# Patient Record
Sex: Female | Born: 1990 | Race: Black or African American | Hispanic: No | Marital: Single | Smoking: Current some day smoker
Health system: Southern US, Community
[De-identification: ages and names within clinical notes are randomized; demographics above are authoritative.]

## PROBLEM LIST (undated history)

## (undated) ENCOUNTER — Inpatient Hospital Stay (HOSPITAL_COMMUNITY): Payer: Self-pay

## (undated) DIAGNOSIS — G4700 Insomnia, unspecified: Secondary | ICD-10-CM

## (undated) DIAGNOSIS — A5609 Other chlamydial infection of lower genitourinary tract: Secondary | ICD-10-CM

## (undated) DIAGNOSIS — F419 Anxiety disorder, unspecified: Secondary | ICD-10-CM

## (undated) DIAGNOSIS — A549 Gonococcal infection, unspecified: Secondary | ICD-10-CM

## (undated) DIAGNOSIS — Z202 Contact with and (suspected) exposure to infections with a predominantly sexual mode of transmission: Secondary | ICD-10-CM

## (undated) DIAGNOSIS — L309 Dermatitis, unspecified: Secondary | ICD-10-CM

## (undated) DIAGNOSIS — F32A Depression, unspecified: Secondary | ICD-10-CM

## (undated) DIAGNOSIS — Z973 Presence of spectacles and contact lenses: Secondary | ICD-10-CM

## (undated) DIAGNOSIS — Z01419 Encounter for gynecological examination (general) (routine) without abnormal findings: Secondary | ICD-10-CM

## (undated) DIAGNOSIS — F329 Major depressive disorder, single episode, unspecified: Secondary | ICD-10-CM

## (undated) HISTORY — DX: Depression, unspecified: F32.A

## (undated) HISTORY — DX: Gonococcal infection, unspecified: A54.9

## (undated) HISTORY — DX: Presence of spectacles and contact lenses: Z97.3

## (undated) HISTORY — DX: Encounter for gynecological examination (general) (routine) without abnormal findings: Z01.419

## (undated) HISTORY — DX: Insomnia, unspecified: G47.00

## (undated) HISTORY — DX: Major depressive disorder, single episode, unspecified: F32.9

## (undated) HISTORY — DX: Dermatitis, unspecified: L30.9

## (undated) HISTORY — PX: WISDOM TOOTH EXTRACTION: SHX21

---
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
## 2012-05-28 ENCOUNTER — Ambulatory Visit (INDEPENDENT_AMBULATORY_CARE_PROVIDER_SITE_OTHER): Payer: BC Managed Care – PPO | Admitting: Medical

## 2012-05-28 ENCOUNTER — Encounter: Payer: Self-pay | Admitting: Medical

## 2012-05-28 VITALS — BP 110/80 | HR 68 | Temp 98.6°F | Resp 16 | Ht 61.5 in | Wt 125.0 lb

## 2012-05-28 DIAGNOSIS — L259 Unspecified contact dermatitis, unspecified cause: Secondary | ICD-10-CM

## 2012-05-28 DIAGNOSIS — R82998 Other abnormal findings in urine: Secondary | ICD-10-CM

## 2012-05-28 DIAGNOSIS — G4700 Insomnia, unspecified: Secondary | ICD-10-CM

## 2012-05-28 DIAGNOSIS — R8290 Unspecified abnormal findings in urine: Secondary | ICD-10-CM

## 2012-05-28 DIAGNOSIS — Z0000 Encounter for general adult medical examination without abnormal findings: Secondary | ICD-10-CM

## 2012-05-28 DIAGNOSIS — L309 Dermatitis, unspecified: Secondary | ICD-10-CM

## 2012-05-28 LAB — POCT URINALYSIS DIPSTICK
Urobilinogen, UA: 2
pH, UA: 5

## 2012-05-28 LAB — CBC WITH DIFFERENTIAL/PLATELET
Basophils Absolute: 0 10*3/uL (ref 0.0–0.1)
Basophils Relative: 1 % (ref 0–1)
Eosinophils Relative: 3 % (ref 0–5)
Lymphocytes Relative: 37 % (ref 12–46)
MCHC: 35.2 g/dL (ref 30.0–36.0)
Monocytes Absolute: 0.5 10*3/uL (ref 0.1–1.0)
Neutro Abs: 2.4 10*3/uL (ref 1.7–7.7)
Platelets: 256 10*3/uL (ref 150–400)
RDW: 13.1 % (ref 11.5–15.5)
WBC: 4.9 10*3/uL (ref 4.0–10.5)

## 2012-05-28 LAB — BASIC METABOLIC PANEL
CO2: 27 mEq/L (ref 19–32)
Chloride: 102 mEq/L (ref 96–112)
Creat: 0.68 mg/dL (ref 0.50–1.10)
Potassium: 4.2 mEq/L (ref 3.5–5.3)

## 2012-05-28 LAB — TSH: TSH: 1.251 u[IU]/mL (ref 0.350–4.500)

## 2012-05-28 LAB — LIPID PANEL
HDL: 64 mg/dL (ref >39)
LDL Cholesterol: 121 mg/dL — ABNORMAL HIGH (ref 0–99)
Triglycerides: 47 mg/dL (ref <150)

## 2012-05-28 MED ORDER — TRIAMCINOLONE ACETONIDE 0.1 % EX CREA: TOPICAL_CREAM | Freq: Two times a day (BID) | CUTANEOUS | Status: DC

## 2012-05-28 MED ORDER — TRAZODONE HCL 50 MG PO TABS: 25.0000 mg | ORAL_TABLET | Freq: Every day | ORAL | Status: DC

## 2012-05-28 NOTE — Progress Notes (Signed)
Subjective:   HPI  Christine Ross is a 22 y.o. female who presents for a complete physical.  New patient today.   Sees gynecology, has Implanon.   Preventative care: Last ophthalmology visit:11/2011 Last dental visit: routinely  Prior vaccinations: TD or Tdap:09/2008 Influenza:no to flu vaccine  Concerns: Insomnia - longstanding problems with sleep.  She notes that she has trouble shutting down.  Sometimes can't get to sleep until early in the morning, sometimes as late as 5am.  She has been treated for depression and anxiety in the past, but the depression resolved.   Still has some anxiety issues.  She is a full time student in college, has some relationship stress, not exercising.  Has tried various OTC remedies for sleep without success including benadryl.  Has tried one prescription medication but can't recall the name.  Requests medication to help.  Eczema - hx/o, mostly arms, scalp.  Wants cream for flare ups.   Uses lotion daily.  No flare up currently.  Reviewed their medical, surgical, family, social, medication, and allergy history and updated chart as appropriate.   Past Medical History  Diagnosis Date  . Insomnia   . Depression     age 55yo, resolved  . Wears glasses   . Routine gynecological examination     Dr. Jean Ross  . Eczema     Past Surgical History  Procedure Date  . Wisdom tooth extraction     History reviewed. No pertinent family history.  History   Social History  . Marital Status: Unknown    Spouse Name: N/A    Number of Children: N/A  . Years of Education: N/A   Occupational History  . Not on file.   Social History Main Topics  . Smoking status: Never Smoker   . Smokeless tobacco: Not on file  . Alcohol Use: No  . Drug Use: No  . Sexually Active: Not on file   Other Topics Concern  . Not on file   Social History Narrative   Single, no prior pregnancies, in criminal justice program at [employer]    Current  Outpatient Prescriptions on File Prior to Visit  Medication Sig Dispense Refill  . etonogestrel (IMPLANON) 68 MG IMPL implant Inject 1 each into the skin once.      . traZODone (DESYREL) 50 MG tablet Take 0.5-1 tablets (25-50 mg total) by mouth at bedtime.  30 tablet  1    Allergies  Allergen Reactions  . Nickel      Review of Systems Constitutional: -fever, -chills, -sweats, -unexpected weight change, -decreased appetite, -fatigue Allergy: -sneezing, -itching, -congestion Dermatology: -changing moles, --rash, -lumps ENT: -runny nose, -ear pain, -sore throat, -hoarseness, -sinus pain, -teeth pain, - ringing in ears, -hearing loss, -nosebleeds Cardiology: -chest pain, -palpitations, -swelling, -difficulty breathing when lying flat, -waking up short of breath Respiratory: -cough, -shortness of breath, -difficulty breathing with exercise or exertion, -wheezing, -coughing up blood Gastroenterology: -abdominal pain, -nausea, -vomiting, -diarrhea, -constipation, -blood in stool, -changes in bowel movement, -difficulty swallowing or eating Hematology: -bleeding, -bruising  Musculoskeletal: -joint aches, -muscle aches, -joint swelling, -back pain, -neck pain, -cramping, -changes in gait Ophthalmology: denies vision changes, eye redness, itching, discharge Urology: -burning with urination, -difficulty urinating, -blood in urine, -urinary frequency, -urgency, -incontinence Neurology: -headache, -weakness, -tingling, -numbness, -memory loss, -falls, -dizziness Psychology: -depressed mood, -agitation, -sleep problems     Objective:   Physical Exam  Filed Vitals:   05/28/12 1135  BP: 110/80  Pulse: 68  Temp:  98.6 F (37 C)  Resp: 16    General appearance: alert, no distress, WD/WN, lean AA female Skin: unremarkable, no worrisome lesions HEENT: normocephalic, conjunctiva/corneas normal, sclerae anicteric, PERRLA, EOMi, nares patent, no discharge or erythema, pharynx normal Oral cavity:  MMM, tongue normal, teeth in good repair Neck: supple, no lymphadenopathy, no thyromegaly, no masses, normal ROM, no bruits Chest: non tender, normal shape and expansion Heart: RRR, normal S1, S2, no murmurs Lungs: CTA bilaterally, no wheezes, rhonchi, or rales Abdomen: +bs, soft, non tender, non distended, no masses, no hepatomegaly, no splenomegaly, no bruits Back: non tender, normal ROM, no scoliosis Musculoskeletal: upper extremities non tender, no obvious deformity, normal ROM throughout, lower extremities non tender, no obvious deformity, normal ROM throughout Extremities: no edema, no cyanosis, no clubbing Pulses: 2+ symmetric, upper and lower extremities, normal cap refill Neurological: alert, oriented x 3, CN2-12 intact, strength normal upper extremities and lower extremities, sensation normal throughout, DTRs 2+ throughout, no cerebellar signs, gait normal Psychiatric: normal affect, behavior normal, pleasant  Breast/gyn - deferred to gynecology   Assessment and Plan :    Encounter Diagnoses  Name Primary?  . Routine general medical examination at a health care facility Yes  . Insomnia   . Eczema   . Abnormal urinalysis     Physical exam - discussed healthy lifestyle, diet, exercise, preventative care, vaccinations, and addressed their concerns. Declines flu vaccine. Baseline screening labs today.  Insomnia - script for trial of Trazodone.  Discussed sleep hygiene, begin exercise, discuss strategies to improve sleep.  Advised she either return here or seek counseling to deal with some of her anxiety and stressors that are likely interfering with sleep.  Eczema - advised daily moisturizing lotion, avoid hot showers, treat the flares with short term triamcinolone prescribed today.  Recheck in 3-4 wk.    Abnormal urinalysis - reviewed after patient left and after urine apparently discarded.  She is asymptomatic.  will need to repeat urinalysis in a week or 2.   Follow-up  pending labs

## 2013-11-03 ENCOUNTER — Ambulatory Visit (INDEPENDENT_AMBULATORY_CARE_PROVIDER_SITE_OTHER): Payer: BC Managed Care – PPO | Admitting: Obstetrics

## 2013-11-03 ENCOUNTER — Encounter: Payer: Self-pay | Admitting: Obstetrics

## 2013-11-03 VITALS — BP 126/83 | HR 80 | Temp 98.6°F | Wt 130.0 lb

## 2013-11-03 DIAGNOSIS — Z3046 Encounter for surveillance of implantable subdermal contraceptive: Secondary | ICD-10-CM

## 2013-11-03 NOTE — Progress Notes (Signed)

## 2013-11-17 ENCOUNTER — Ambulatory Visit: Payer: BC Managed Care – PPO | Admitting: Obstetrics

## 2013-11-28 ENCOUNTER — Ambulatory Visit (INDEPENDENT_AMBULATORY_CARE_PROVIDER_SITE_OTHER): Payer: BC Managed Care – PPO | Admitting: Obstetrics

## 2013-11-28 VITALS — BP 124/82 | HR 76 | Temp 98.4°F | Wt 131.0 lb

## 2013-11-28 DIAGNOSIS — Z3046 Encounter for surveillance of implantable subdermal contraceptive: Secondary | ICD-10-CM

## 2013-11-28 DIAGNOSIS — Z0000 Encounter for general adult medical examination without abnormal findings: Secondary | ICD-10-CM

## 2013-11-28 DIAGNOSIS — Z3202 Encounter for pregnancy test, result negative: Secondary | ICD-10-CM

## 2013-11-28 DIAGNOSIS — Z3009 Encounter for other general counseling and advice on contraception: Secondary | ICD-10-CM

## 2013-11-28 LAB — POCT URINE PREGNANCY: Preg Test, Ur: NEGATIVE

## 2013-11-28 MED ORDER — LEVONORGESTREL-ETHINYL ESTRAD 0.15-30 MG-MCG PO TABS: 1.0000 | ORAL_TABLET | Freq: Every day | ORAL | Status: DC

## 2013-11-28 MED ORDER — PNV PRENATAL PLUS MULTIVITAMIN 27-1 MG PO TABS: 1.0000 | ORAL_TABLET | Freq: Every day | ORAL | Status: DC

## 2013-11-29 ENCOUNTER — Encounter: Payer: Self-pay | Admitting: Obstetrics

## 2013-11-29 NOTE — Progress Notes (Signed)
Subjective:    Christine Ross is a 23 y.o. female who presents for contraception counseling. The patient has no complaints today. The patient is sexually active. Pertinent past medical history: current smoker.  The information documented in the HPI was reviewed and verified.  Menstrual History: OB History   Grav Para Term Preterm Abortions TAB SAB Ect Mult Living                   Patient's last menstrual period was 10/27/2013.   There are no active problems to display for this patient.  Past Medical History  Diagnosis Date  . Insomnia   . Depression     age 23yo, resolved  . Wears glasses   . Routine gynecological examination     Dr. Jean RosenthalJackson  . Eczema     Past Surgical History  Procedure Laterality Date  . Wisdom tooth extraction      Current outpatient prescriptions:etonogestrel (IMPLANON) 68 MG IMPL implant, Inject 1 each into the skin once., Disp: , Rfl: ;  levonorgestrel-ethinyl estradiol (NORDETTE) 0.15-30 MG-MCG tablet, Take 1 tablet by mouth daily., Disp: 1 Package, Rfl: 11;  Prenatal Vit-Fe Fumarate-FA (PNV PRENATAL PLUS MULTIVITAMIN) 27-1 MG TABS, Take 1 tablet by mouth daily before breakfast., Disp: 30 tablet, Rfl: 11 traZODone (DESYREL) 50 MG tablet, Take 0.5-1 tablets (25-50 mg total) by mouth at bedtime., Disp: 30 tablet, Rfl: 1;  triamcinolone cream (KENALOG) 0.1 %, Apply topically 2 (two) times daily., Disp: 60 g, Rfl: 0 Allergies  Allergen Reactions  . Nickel     History  Substance Use Topics  . Smoking status: Current Every Day Smoker  . Smokeless tobacco: Not on file  . Alcohol Use: Yes     Comment: social    History reviewed. No pertinent family history.     Review of Systems Constitutional: negative for weight loss Genitourinary:negative for abnormal menstrual periods and vaginal discharge   Objective:   BP 124/82  Pulse 76  Temp(Src) 98.4 F (36.9 C)  Wt 131 lb (59.421 kg)  LMP 10/27/2013   General:   alert Left arm:  Nexplanon  removal site clean, nontender.  Lab Review Urine pregnancy test is negative Labs reviewed yes Radiologic studies reviewed no    Assessment:    23 y.o., starting OCP (estrogen/progesterone), smoking cessation recommended.   Plan:    All questions answered. Contraception: OCP (estrogen/progesterone). Discussed healthy lifestyle modifications. F/U for annual  Meds ordered this encounter  Medications  . levonorgestrel-ethinyl estradiol (NORDETTE) 0.15-30 MG-MCG tablet    Sig: Take 1 tablet by mouth daily.    Dispense:  1 Package    Refill:  11  . Prenatal Vit-Fe Fumarate-FA (PNV PRENATAL PLUS MULTIVITAMIN) 27-1 MG TABS    Sig: Take 1 tablet by mouth daily before breakfast.    Dispense:  30 tablet    Refill:  11   Orders Placed This Encounter  Procedures  . POCT urine pregnancy

## 2013-12-23 ENCOUNTER — Ambulatory Visit (INDEPENDENT_AMBULATORY_CARE_PROVIDER_SITE_OTHER): Payer: BC Managed Care – PPO | Admitting: Medical

## 2013-12-23 ENCOUNTER — Ambulatory Visit: Payer: BC Managed Care – PPO | Admitting: Medical

## 2013-12-23 ENCOUNTER — Encounter: Payer: Self-pay | Admitting: Medical

## 2013-12-23 VITALS — BP 102/80 | HR 80 | Temp 98.2°F | Resp 16 | Wt 130.0 lb

## 2013-12-23 DIAGNOSIS — Z202 Contact with and (suspected) exposure to infections with a predominantly sexual mode of transmission: Secondary | ICD-10-CM

## 2013-12-23 DIAGNOSIS — R109 Unspecified abdominal pain: Secondary | ICD-10-CM

## 2013-12-23 DIAGNOSIS — R81 Glycosuria: Secondary | ICD-10-CM

## 2013-12-23 DIAGNOSIS — J029 Acute pharyngitis, unspecified: Secondary | ICD-10-CM

## 2013-12-23 LAB — POCT URINALYSIS DIPSTICK
Bilirubin, UA: NEGATIVE
Glucose, UA: 100
Ketones, UA: NEGATIVE
Nitrite, UA: NEGATIVE
PH UA: 6
SPEC GRAV UA: 1.015
UROBILINOGEN UA: NEGATIVE

## 2013-12-23 LAB — POCT CBG (FASTING - GLUCOSE)-MANUAL ENTRY: GLUCOSE FASTING, POC: 71 mg/dL (ref 70–99)

## 2013-12-23 LAB — POCT RAPID STREP A (OFFICE): Rapid Strep A Screen: NEGATIVE

## 2013-12-23 LAB — POCT URINE PREGNANCY: Preg Test, Ur: NEGATIVE

## 2013-12-23 NOTE — Progress Notes (Signed)
Subjective:  Christine Ross is a 23 y.o. female who presents for evaluation of sore throat.  She has not had a recent close exposure to someone with proven streptococcal pharyngitis.  Associated symptoms include bad sore throat, swollen tonsil x 3 days, left side only, bottom of stomach hurts to.  Radiated down to the knee.  Has a slight cough from scratchy throat.  No ear pain, no fever, no NVD, no constipation, no head or chest congestion.  Has had some low back pain across the whole back.  No burning with urination, no urgency, no blood in urine, no vaginal discharge, no itching no redness.  No bowel changes.  No lymph node enlargement.  No rash, no leg weakness, numbness, tingling.  Not taking anything for symptoms other than 2 ibuprofen few nights ago.  She did inform me that her boyfriend told her today that he has had a white penile discharge for the past 3 days.  She has had unprotected intercourse and oral sex with him the last few days.  They have been together since 05/2013. No other aggravating or relieving factors.  No other c/o.  The following portions of the patient's history were reviewed and updated as appropriate: allergies, current medications, past medical history, past social history, past surgical history and problem list.  ROS as in subjective   Objective: Filed Vitals:   12/23/13 1436  BP: 102/80  Pulse: 80  Temp: 98.2 F (36.8 C)  Resp: 16    General appearance: no distress, WD/WN HEENT: normocephalic, conjunctiva/corneas normal, sclerae anicteric, nares patent, no discharge or erythema, pharynx with erythema, no exudate.  Oral cavity: MMM, no lesions  Neck: supple, shoddy tender anterior nodes on the left, no thyromegaly Lungs: CTA bilaterally, no wheezes, rhonchi, or rales Abdomen: +bs, soft, non tender, non distended, no masses, no hepatomegaly, no splenomegaly No inguinal lymphadenopathy Back: nontender MSK: legs nontender, normal ROM   Laboratory Strep  test done. Results:negative.   Results for orders placed in visit on 12/23/13 (from the past 24 hour(s))  POCT RAPID STREP A (OFFICE)     Status: None   Collection Time    12/23/13  3:11 PM      Result Value Ref Range   Rapid Strep A Screen Negative  Negative  POCT URINALYSIS DIPSTICK     Status: None   Collection Time    12/23/13  3:11 PM      Result Value Ref Range   Color, UA YELLOW     Clarity, UA CLOUDY     Glucose, UA 100     Bilirubin, UA NEG     Ketones, UA NEG     Spec Grav, UA 1.015     Blood, UA LARGE     pH, UA 6.0     Protein, UA MODERATE     Urobilinogen, UA negative     Nitrite, UA NEG     Leukocytes, UA large (3+)    POCT URINE PREGNANCY     Status: None   Collection Time    12/23/13  3:12 PM      Result Value Ref Range   Preg Test, Ur Negative    POCT CBG (FASTING - GLUCOSE)-MANUAL ENTRY     Status: None   Collection Time    12/23/13  3:12 PM      Result Value Ref Range   Glucose Fasting, POC 71  70 - 99 mg/dL      Assessment and Plan: Encounter Diagnoses  Name Primary?  . Sore throat Yes  . Abdominal pain, unspecified site     We discussed her symptoms and concerns.  Unfortunately, her boyfriend made her aware today of a white penile discharge.  Thus, although strep negative, can't rule out STD or other cause.   We will send the urine for GC Chlamydia and urine culture. She declines treatment for gonorrhea and chlamydia today empirically although it sounds like she has had a venereal disease contact.  she will use supportive care to treat her sore throat respiratory symptoms, and we will call Monday with results

## 2013-12-24 LAB — GC/CHLAMYDIA PROBE AMP
CT Probe RNA: POSITIVE — AB
GC Probe RNA: NEGATIVE

## 2013-12-25 ENCOUNTER — Other Ambulatory Visit: Payer: Self-pay | Admitting: Medical

## 2013-12-25 LAB — URINE CULTURE: Colony Count: >=100000

## 2013-12-25 MED ORDER — AZITHROMYCIN 1 G PO PACK: 1.0000 g | PACK | Freq: Once | ORAL | Status: DC

## 2013-12-26 NOTE — Progress Notes (Signed)
Pt called, advised of lab findings and rx.  She will advise partner to seek treatment.

## 2014-01-10 ENCOUNTER — Inpatient Hospital Stay (HOSPITAL_COMMUNITY)
Admission: AD | Admit: 2014-01-10 | Discharge: 2014-01-10 | Disposition: A | Discharge status: Home / Self Care | Payer: BC Managed Care – PPO | Source: Ambulatory Visit | Attending: Obstetrics | Admitting: Obstetrics

## 2014-01-10 ENCOUNTER — Ambulatory Visit: Payer: BC Managed Care – PPO | Admitting: Medical

## 2014-01-10 ENCOUNTER — Ambulatory Visit (INDEPENDENT_AMBULATORY_CARE_PROVIDER_SITE_OTHER): Payer: BC Managed Care – PPO | Admitting: Medical

## 2014-01-10 ENCOUNTER — Encounter: Payer: Self-pay | Admitting: Medical

## 2014-01-10 VITALS — BP 110/70 | HR 88 | Temp 98.2°F | Resp 15 | Wt 127.0 lb

## 2014-01-10 DIAGNOSIS — N938 Other specified abnormal uterine and vaginal bleeding: Secondary | ICD-10-CM | POA: Insufficient documentation

## 2014-01-10 DIAGNOSIS — N925 Other specified irregular menstruation: Secondary | ICD-10-CM

## 2014-01-10 DIAGNOSIS — Z309 Encounter for contraceptive management, unspecified: Secondary | ICD-10-CM

## 2014-01-10 DIAGNOSIS — Z202 Contact with and (suspected) exposure to infections with a predominantly sexual mode of transmission: Secondary | ICD-10-CM

## 2014-01-10 DIAGNOSIS — N949 Unspecified condition associated with female genital organs and menstrual cycle: Secondary | ICD-10-CM

## 2014-01-10 LAB — URINE MICROSCOPIC-ADD ON

## 2014-01-10 LAB — URINALYSIS, ROUTINE W REFLEX MICROSCOPIC
BILIRUBIN URINE: NEGATIVE
GLUCOSE, UA: NEGATIVE mg/dL
KETONES UR: NEGATIVE mg/dL
Nitrite: NEGATIVE
PH: 6.5 (ref 5.0–8.0)
Protein, ur: NEGATIVE mg/dL
Specific Gravity, Urine: 1.02 (ref 1.005–1.030)
Urobilinogen, UA: 4 mg/dL — ABNORMAL HIGH (ref 0.0–1.0)

## 2014-01-10 LAB — POCT URINE PREGNANCY: Preg Test, Ur: NEGATIVE

## 2014-01-10 LAB — POCT HEMOGLOBIN: Hemoglobin: 14.4 g/dL (ref 12.2–16.2)

## 2014-01-10 MED ORDER — IBUPROFEN 600 MG PO TABS: 600.0000 mg | ORAL_TABLET | Freq: Three times a day (TID) | ORAL | Status: DC | PRN

## 2014-01-10 NOTE — Discharge Instructions (Signed)
Abnormal Uterine Bleeding Abnormal uterine bleeding can affect women at various stages in life, including teenagers, women in their reproductive years, pregnant women, and women who have reached menopause. Several kinds of uterine bleeding are considered abnormal, including:  Bleeding or spotting between periods.   Bleeding after sexual intercourse.   Bleeding that is heavier or more than normal.   Periods that last longer than usual.  Bleeding after menopause.  Many cases of abnormal uterine bleeding are minor and simple to treat, while others are more serious. Any type of abnormal bleeding should be evaluated by your health care provider. Treatment will depend on the cause of the bleeding. HOME CARE INSTRUCTIONS Monitor your condition for any changes. The following actions may help to alleviate any discomfort you are experiencing:  Avoid the use of tampons and douches as directed by your health care provider.  Change your pads frequently. You should get regular pelvic exams and Pap tests. Keep all follow-up appointments for diagnostic tests as directed by your health care provider.  SEEK MEDICAL CARE IF:   Your bleeding lasts more than 1 week.   You feel dizzy at times.  SEEK IMMEDIATE MEDICAL CARE IF:   You pass out.   You are changing pads every 15 to 30 minutes.   You have abdominal pain.  You have a fever.   You become sweaty or weak.   You are passing large blood clots from the vagina.   You start to feel nauseous and vomit. MAKE SURE YOU:   Understand these instructions.  Will watch your condition.  Will get help right away if you are not doing well or get worse. Document Released: 05/12/2005 Document Revised: 05/17/2013 Document Reviewed: 12/09/2012 Exit[employer] Patient Information 2015 [employer]. This information is not intended to replace advice given to you by your health care provider. Make sure you discuss any questions you have with your  health care provider.  

## 2014-01-10 NOTE — MAU Note (Signed)
Patient states she has had heavy bleeding off and on since the end of July. Was seen by her primary provider today. Stats she had been taking BCP's with last dose 2 days ago.  Has abdominal and back pain that comes and goes.

## 2014-01-10 NOTE — MAU Provider Note (Signed)
History     CSN: [id number]  Arrival date and time: 01/10/14 1705   None     Chief Complaint  Patient presents with  . Vaginal Bleeding   HPI 23 y.o. female w/ ongoing irregular vaginal bleeding. Had Nexplanon removed in July, started new OCPs, irregular bleeding since, ran out of pills a few days ago and has not restarted them. Saw her PCP today for this complaint, they repeated GC/CT, as patient was recently treated for Chlamydia and partner was treated for Gonorrhea.   Past Medical History  Diagnosis Date  . Insomnia   . Depression     age 33yo, resolved  . Wears glasses   . Routine gynecological examination     Dr. Jean Rosenthal  . Eczema     Past Surgical History  Procedure Laterality Date  . Wisdom tooth extraction      No family history on file.  History  Substance Use Topics  . Smoking status: Current Every Day Smoker  . Smokeless tobacco: Not on file  . Alcohol Use: Yes     Comment: social    Allergies:  Allergies  Allergen Reactions  . Nickel     Prescriptions prior to admission  Medication Sig Dispense Refill  . ibuprofen (ADVIL,MOTRIN) 600 MG tablet Take 1 tablet (600 mg total) by mouth every 8 (eight) hours as needed.  30 tablet  0  . levonorgestrel-ethinyl estradiol (NORDETTE) 0.15-30 MG-MCG tablet Take 1 tablet by mouth daily.  1 Package  11  . Prenatal Vit-Fe Fumarate-FA (PNV PRENATAL PLUS MULTIVITAMIN) 27-1 MG TABS Take 1 tablet by mouth daily before breakfast.  30 tablet  11  . traZODone (DESYREL) 50 MG tablet Take 0.5-1 tablets (25-50 mg total) by mouth at bedtime.  30 tablet  1  . triamcinolone cream (KENALOG) 0.1 % Apply topically 2 (two) times daily.  60 g  0    Review of Systems  Constitutional: Negative.   Respiratory: Negative.   Cardiovascular: Negative.   Gastrointestinal: Negative for nausea, vomiting, abdominal pain, diarrhea and constipation.  Genitourinary: Negative for dysuria, urgency, frequency, hematuria and flank pain.    Positive vaginal bleeding  Musculoskeletal: Negative.   Neurological: Negative.   Psychiatric/Behavioral: Negative.    Physical Exam   Blood pressure 120/79, pulse 86, temperature 99.2 F (37.3 C), temperature source Oral, resp. rate 16, height 5' (1.524 m), weight 126 lb 9.6 oz (57.425 kg), last menstrual period 01/08/2014, SpO2 99.00%.  Physical Exam  Nursing note and vitals reviewed. Constitutional: She is oriented to person, place, and time. She appears well-developed and well-nourished. No distress.  Cardiovascular: Normal rate.   Respiratory: Effort normal.  Neurological: She is alert and oriented to person, place, and time.  Skin: Skin is warm and dry.  Psychiatric: She has a normal mood and affect.    MAU Course  Procedures  Results for orders placed during the hospital encounter of 01/10/14 (from the past 24 hour(s))  URINALYSIS, ROUTINE W REFLEX MICROSCOPIC     Status: Abnormal   Collection Time    01/10/14  5:30 PM      Result Value Ref Range   Color, Urine YELLOW  YELLOW   APPearance CLEAR  CLEAR   Specific Gravity, Urine 1.020  1.005 - 1.030   pH 6.5  5.0 - 8.0   Glucose, UA NEGATIVE  NEGATIVE mg/dL   Hgb urine dipstick MODERATE (*) NEGATIVE   Bilirubin Urine NEGATIVE  NEGATIVE   Ketones, ur NEGATIVE  NEGATIVE mg/dL  Protein, ur NEGATIVE  NEGATIVE mg/dL   Urobilinogen, UA 4.0 (*) 0.0 - 1.0 mg/dL   Nitrite NEGATIVE  NEGATIVE   Leukocytes, UA TRACE (*) NEGATIVE  URINE MICROSCOPIC-ADD ON     Status: Abnormal   Collection Time    01/10/14  5:30 PM      Result Value Ref Range   Squamous Epithelial / LPF FEW (*) RARE   WBC, UA 7-10  <3 WBC/hpf   RBC / HPF 3-6  <3 RBC/hpf   Urine-Other MUCOUS PRESENT     Negative UPT and Hgb 14.4 in office today  Assessment and Plan   1. DUB (dysfunctional uterine bleeding)   Spoke to patient in triage - no room available. No change in symptoms today, just wants second opinion as she is concerned about her ongoing  bleeding. Advised that we could do pelvic exam tonight, but GC/CT had already been completed in office today, Hgb was good and UPT negative, so her condition appears to be very stable. Pt opts to leave prior to exam, pick up meds as prescribed by PCP. Advised to keep PCP's recommendations, may f/u w/ Dr. Clearance CootsHarper for eval outpatient if desired. Bleeding precautions rev'd.     Medication List         ibuprofen 600 MG tablet  Commonly known as:  ADVIL,MOTRIN  Take 1 tablet (600 mg total) by mouth every 8 (eight) hours as needed.     levonorgestrel-ethinyl estradiol 0.15-30 MG-MCG tablet  Commonly known as:  NORDETTE  Take 1 tablet by mouth daily.     PNV PRENATAL PLUS MULTIVITAMIN 27-1 MG Tabs  Take 1 tablet by mouth daily before breakfast.     traZODone 50 MG tablet  Commonly known as:  DESYREL  Take 0.5-1 tablets (25-50 mg total) by mouth at bedtime.     triamcinolone cream 0.1 %  Commonly known as:  KENALOG  Apply topically 2 (two) times daily.            Follow-up Information   Follow up with Ernst BreachYSINGER, DAVID SHANE, PA-C.   Specialty:  Family Medicine   Contact information:   [address] [place] [id number] [phone]         [facility] IncFRAZIER,Mariano Doshi 01/10/2014, 7:46 PM

## 2014-01-10 NOTE — Progress Notes (Signed)
Subjective: I saw her recently for belly pain, was found to be Chlamydia positive. She did take the treatment, and she notes that her boyfriend was diagnosed with gonorrhea positive but he was treated for both Chlamydia and gonorrhea.  He notes that the week before I saw her she started getting. Had a period that weekend.  However in the last 4 days started getting another menstrual period. It has not been a full month.  She is having heavy bleeding, but did clots of blood, painful cramping, and she is only had this once prior.  She was on Implanon but this was removed July 9.  She then begin oral contraception which is only been on about a month or so.  No fever, no urinary complaints, no other belly pain, no other symptoms. Is having to use sanitary napkins plus pad because of all the bleeding which is soaking through into her bed sheets  Review of systems as in subjective   Objective: BP 110/70  Pulse 88  Temp(Src) 98.2 F (36.8 C) (Oral)  Resp 15  Wt 127 lb (57.607 kg)  LMP 01/08/2014  General appearance: alert, no distress, WD/WN  Neck: supple, no lymphadenopathy, no thyromegaly, no masses Heart: RRR, normal S1, S2, no murmurs Lungs: CTA bilaterally, no wheezes, rhonchi, or rales Abdomen: +bs, soft, non tender, non distended, no masses, no hepatomegaly, no splenomegaly Pulses: 2+ symmetric, upper and lower extremities, normal cap refill Gyn: declined   Assessment: Encounter Diagnoses  Name Primary?  . Venereal disease contact Yes  . Dysfunctional uterine bleeding   . Encounter for contraceptive management, unspecified encounter    Plan: Repeat testing today for Chlamydia /gonorrhea, reinforced the need to use condoms that she is not doing.  We check a hemoglobin level today. Advise she continue to take a multivitamin with iron.  We discussed the diagnosis of Chlamydia, risks factors, possible complications, safe sex.  We discussed her bleeding and cramping concerns, the fact she  just switched to a new oral contraceptive.  Discussed possible causes of the bleeding but likely due to hormonal changes.  Begin ibuprofen 600mg , 3 times a day for the next several days to calm down the bleeding and then we'll use a watch and wait approach. She agrees with plan, however if much worse or not improving and let me know

## 2014-01-11 LAB — GC/CHLAMYDIA PROBE AMP
CT PROBE, AMP APTIMA: NEGATIVE
GC Probe RNA: NEGATIVE

## 2014-01-11 NOTE — Progress Notes (Signed)
Patient called and is aware of results 

## 2014-02-23 DIAGNOSIS — Z202 Contact with and (suspected) exposure to infections with a predominantly sexual mode of transmission: Secondary | ICD-10-CM

## 2014-02-23 HISTORY — DX: Contact with and (suspected) exposure to infections with a predominantly sexual mode of transmission: Z20.2

## 2014-02-28 ENCOUNTER — Encounter: Payer: Self-pay | Admitting: Obstetrics

## 2014-02-28 ENCOUNTER — Ambulatory Visit (INDEPENDENT_AMBULATORY_CARE_PROVIDER_SITE_OTHER): Payer: BC Managed Care – PPO | Admitting: Obstetrics

## 2014-02-28 VITALS — BP 132/81 | HR 101 | Temp 97.7°F | Ht 60.0 in | Wt 128.0 lb

## 2014-02-28 DIAGNOSIS — Z0000 Encounter for general adult medical examination without abnormal findings: Secondary | ICD-10-CM

## 2014-02-28 DIAGNOSIS — Z7251 High risk heterosexual behavior: Secondary | ICD-10-CM

## 2014-02-28 DIAGNOSIS — A5609 Other chlamydial infection of lower genitourinary tract: Secondary | ICD-10-CM

## 2014-02-28 DIAGNOSIS — Z30011 Encounter for initial prescription of contraceptive pills: Secondary | ICD-10-CM

## 2014-02-28 DIAGNOSIS — Z01419 Encounter for gynecological examination (general) (routine) without abnormal findings: Secondary | ICD-10-CM

## 2014-02-28 DIAGNOSIS — A549 Gonococcal infection, unspecified: Secondary | ICD-10-CM

## 2014-02-28 HISTORY — DX: Other chlamydial infection of lower genitourinary tract: A56.09

## 2014-02-28 HISTORY — DX: Gonococcal infection, unspecified: A54.9

## 2014-02-28 LAB — POCT URINE PREGNANCY: Preg Test, Ur: NEGATIVE

## 2014-02-28 MED ORDER — CEFTRIAXONE SODIUM 1 G IJ SOLR
250.0000 mg | Freq: Once | INTRAMUSCULAR | Status: AC
Start: 2014-02-28 — End: 2014-02-28
  Administered 2014-02-28: 250 mg via INTRAMUSCULAR

## 2014-03-01 ENCOUNTER — Encounter: Payer: Self-pay | Admitting: Obstetrics

## 2014-03-01 ENCOUNTER — Other Ambulatory Visit: Payer: Self-pay | Admitting: Obstetrics

## 2014-03-01 DIAGNOSIS — B9689 Other specified bacterial agents as the cause of diseases classified elsewhere: Secondary | ICD-10-CM

## 2014-03-01 DIAGNOSIS — N760 Acute vaginitis: Secondary | ICD-10-CM

## 2014-03-01 DIAGNOSIS — A5609 Other chlamydial infection of lower genitourinary tract: Secondary | ICD-10-CM

## 2014-03-01 LAB — GC/CHLAMYDIA PROBE AMP
CT Probe RNA: POSITIVE — AB
GC PROBE AMP APTIMA: NEGATIVE

## 2014-03-01 LAB — WET PREP BY MOLECULAR PROBE
CANDIDA SPECIES: NEGATIVE
Gardnerella vaginalis: POSITIVE — AB
TRICHOMONAS VAG: NEGATIVE

## 2014-03-01 MED ORDER — AZITHROMYCIN 250 MG PO TABS: 1000.0000 mg | ORAL_TABLET | Freq: Once | ORAL | Status: DC

## 2014-03-01 MED ORDER — METRONIDAZOLE 500 MG PO TABS: 500.0000 mg | ORAL_TABLET | Freq: Two times a day (BID) | ORAL | Status: DC

## 2014-03-01 NOTE — Progress Notes (Signed)
Subjective:     Christine Ross is a 23 y.o. female here for a routine exam.  Current complaints: none.    Personal health questionnaire:  Is patient Ashkenazi Jewish, have a family history of breast and/or ovarian cancer: no Is there a family history of uterine cancer diagnosed at age < 350, gastrointestinal cancer, urinary tract cancer, family member who is a Personnel officerLynch syndrome-associated carrier: no Is the patient overweight and hypertensive, family history of diabetes, personal history of gestational diabetes or PCOS: no Is patient over 6755, have PCOS,  family history of premature CHD under age 22, diabetes, smoke, have hypertension or peripheral artery disease:  no At any time, has a partner hit, kicked or otherwise hurt or frightened you?: no Over the past 2 weeks, have you felt down, depressed or hopeless?: no Over the past 2 weeks, have you felt little interest or pleasure in doing things?:no   Gynecologic History Patient's last menstrual period was 02/04/2014. Contraception: OCP (estrogen/progesterone) Last Pap: not asked. Results were: unknown Last mammogram: n/a. Results were: n/a  Obstetric History OB History  Gravida Para Term Preterm AB SAB TAB Ectopic Multiple Living  0 0 0 0 0 0 0 0 0 0         Past Medical History  Diagnosis Date  . Insomnia   . Depression     age 23yo, resolved  . Wears glasses   . Routine gynecological examination     Dr. Jean RosenthalJackson  . Eczema     Past Surgical History  Procedure Laterality Date  . Wisdom tooth extraction      Current outpatient prescriptions:ibuprofen (ADVIL,MOTRIN) 600 MG tablet, Take 1 tablet (600 mg total) by mouth every 8 (eight) hours as needed., Disp: 30 tablet, Rfl: 0;  Prenatal Vit-Fe Fumarate-FA (PNV PRENATAL PLUS MULTIVITAMIN) 27-1 MG TABS, Take 1 tablet by mouth daily before breakfast., Disp: 30 tablet, Rfl: 11;  levonorgestrel-ethinyl estradiol (NORDETTE) 0.15-30 MG-MCG tablet, Take 1 tablet by mouth daily., Disp: 1  Package, Rfl: 11 Allergies  Allergen Reactions  . Nickel     History  Substance Use Topics  . Smoking status: Current Every Day Smoker    Types: Cigars  . Smokeless tobacco: Not on file  . Alcohol Use: Yes     Comment: social    History reviewed. No pertinent family history.    Review of Systems  Constitutional: negative for fatigue and weight loss Respiratory: negative for cough and wheezing Cardiovascular: negative for chest pain, fatigue and palpitations Gastrointestinal: negative for abdominal pain and change in bowel habits Musculoskeletal:negative for myalgias Neurological: negative for gait problems and tremors Behavioral/Psych: negative for abusive relationship, depression Endocrine: negative for temperature intolerance   Genitourinary:negative for abnormal menstrual periods, genital lesions, hot flashes, sexual problems and vaginal discharge Integument/breast: negative for breast lump, breast tenderness, nipple discharge and skin lesion(s)    Objective:       BP 132/81  Pulse 101  Temp(Src) 97.7 F (36.5 C)  Ht 5' (1.524 m)  Wt 128 lb (58.06 kg)  BMI 25.00 kg/m2  LMP 02/04/2014 General:   alert  Skin:   no rash or abnormalities  Lungs:   clear to auscultation bilaterally  Heart:   regular rate and rhythm, S1, S2 normal, no murmur, click, rub or gallop  Breasts:   normal without suspicious masses, skin or nipple changes or axillary nodes  Abdomen:  normal findings: no organomegaly, soft, non-tender and no hernia  Pelvis:  External genitalia: normal general appearance Urinary system:  urethral meatus normal and bladder without fullness, nontender Vaginal: normal without tenderness, induration or masses Cervix: normal appearance Adnexa: normal bimanual exam Uterus: anteverted and non-tender, normal size   Lab Review Urine pregnancy test Labs reviewed yes Radiologic studies reviewed yes    Assessment:    Healthy female exam.   Contraceptive  surveillance  H/O + chlamydia cervicitis, treated  H/O +GC sexual contact, treated   Plan:    Education reviewed: low fat, low cholesterol diet, safe sex/STD prevention, self breast exams and weight bearing exercise. Contraception: OCP (estrogen/progesterone). Follow up in: 1 year.   Meds ordered this encounter  Medications  . cefTRIAXone (ROCEPHIN) injection 250 mg    Sig:     Order Specific Question:  Antibiotic Indication:    Answer:  STD   Orders Placed This Encounter  Procedures  . WET PREP BY MOLECULAR PROBE  . GC/Chlamydia Probe Amp  . POCT urine pregnancy

## 2014-03-02 LAB — PAP IG W/ RFLX HPV ASCU

## 2014-03-03 LAB — HUMAN PAPILLOMAVIRUS, HIGH RISK: HPV DNA High Risk: NOT DETECTED

## 2014-03-04 ENCOUNTER — Other Ambulatory Visit: Payer: Self-pay | Admitting: Obstetrics

## 2014-03-04 ENCOUNTER — Encounter: Payer: Self-pay | Admitting: Obstetrics

## 2014-03-06 ENCOUNTER — Encounter: Payer: Self-pay | Admitting: Obstetrics

## 2014-03-18 ENCOUNTER — Telehealth: Payer: Self-pay | Admitting: Obstetrics

## 2014-03-18 DIAGNOSIS — A5609 Other chlamydial infection of lower genitourinary tract: Secondary | ICD-10-CM

## 2014-03-28 MED ORDER — AZITHROMYCIN 250 MG PO TABS: 1000.0000 mg | ORAL_TABLET | Freq: Once | ORAL | Status: DC

## 2014-03-29 ENCOUNTER — Other Ambulatory Visit: Payer: Self-pay | Admitting: Obstetrics

## 2014-05-03 ENCOUNTER — Other Ambulatory Visit: Payer: BC Managed Care – PPO

## 2014-05-04 ENCOUNTER — Other Ambulatory Visit (INDEPENDENT_AMBULATORY_CARE_PROVIDER_SITE_OTHER): Payer: BC Managed Care – PPO | Admitting: *Deleted

## 2014-05-04 VITALS — BP 129/85 | HR 81 | Wt 126.0 lb

## 2014-05-04 DIAGNOSIS — Z3201 Encounter for pregnancy test, result positive: Secondary | ICD-10-CM

## 2014-05-04 LAB — POCT URINE PREGNANCY: PREG TEST UR: POSITIVE

## 2014-05-04 NOTE — Progress Notes (Signed)
Pt is in office today for UPT.  Pt states that she has had 2 positive at home test.  UPT in office today is positive.  Pt states that her LMP 03/31/14.  Dating her at 171w6d gestation, with EDD of 01/06/15.  Pt states that she is doing well currently, no concerns.  Pt made aware of early pregnancy warnings and [facility]  Pt states that she has Rx for prenatal and will refill it.  Pt made aware that she will be contacted in order to schedule NOB appt.   Pt states understanding.  BP 129/85 mmHg  Pulse 81  Wt 126 lb (57.153 kg)  LMP 03/31/2014

## 2014-05-11 ENCOUNTER — Encounter: Payer: Self-pay | Admitting: Obstetrics

## 2014-05-12 ENCOUNTER — Telehealth: Payer: Self-pay | Admitting: *Deleted

## 2014-05-12 NOTE — Telephone Encounter (Signed)
Patient is an established patient and recently confirmed her pregnancy. Patient in need of a NOB appointment. Patient advised we scheduled NOB appointments between 10-[redacted] weeks pregnant. Patient in currently [redacted] weeks pregnant. Patient scheduled for 1-21-6 @ 10:30 am

## 2014-05-16 ENCOUNTER — Telehealth: Payer: Self-pay | Admitting: Obstetrics

## 2014-05-22 NOTE — Telephone Encounter (Signed)
[id number] - Still unable to return call

## 2014-05-30 ENCOUNTER — Ambulatory Visit: Payer: BC Managed Care – PPO | Admitting: Obstetrics

## 2014-06-15 ENCOUNTER — Ambulatory Visit (INDEPENDENT_AMBULATORY_CARE_PROVIDER_SITE_OTHER): Payer: Medicaid Other | Admitting: Obstetrics

## 2014-06-15 ENCOUNTER — Inpatient Hospital Stay (HOSPITAL_COMMUNITY)
Admission: AD | Admit: 2014-06-15 | Discharge: 2014-06-15 | Disposition: A | Discharge status: Home / Self Care | Payer: BC Managed Care – PPO | Source: Ambulatory Visit | Attending: Obstetrics | Admitting: Obstetrics

## 2014-06-15 ENCOUNTER — Encounter: Payer: Self-pay | Admitting: Obstetrics

## 2014-06-15 ENCOUNTER — Inpatient Hospital Stay (HOSPITAL_COMMUNITY)
Admit: 2014-06-15 | Discharge: 2014-06-15 | Disposition: A | Discharge status: Home / Self Care | Payer: BC Managed Care – PPO | Attending: Obstetrics | Admitting: Obstetrics

## 2014-06-15 ENCOUNTER — Encounter (HOSPITAL_COMMUNITY): Payer: Self-pay | Admitting: *Deleted

## 2014-06-15 VITALS — BP 124/77 | HR 85 | Temp 97.8°F | Wt 134.0 lb

## 2014-06-15 DIAGNOSIS — O3680X1 Pregnancy with inconclusive fetal viability, fetus 1: Secondary | ICD-10-CM

## 2014-06-15 DIAGNOSIS — Z3401 Encounter for supervision of normal first pregnancy, first trimester: Secondary | ICD-10-CM

## 2014-06-15 DIAGNOSIS — O021 Missed abortion: Secondary | ICD-10-CM | POA: Diagnosis not present

## 2014-06-15 DIAGNOSIS — Z87891 Personal history of nicotine dependence: Secondary | ICD-10-CM | POA: Insufficient documentation

## 2014-06-15 HISTORY — DX: Contact with and (suspected) exposure to infections with a predominantly sexual mode of transmission: Z20.2

## 2014-06-15 LAB — POCT URINALYSIS DIPSTICK
BILIRUBIN UA: NEGATIVE
Blood, UA: NEGATIVE
Glucose, UA: NEGATIVE
Ketones, UA: NEGATIVE
Leukocytes, UA: NEGATIVE
NITRITE UA: NEGATIVE
PH UA: 6
SPEC GRAV UA: 1.02
UROBILINOGEN UA: 1

## 2014-06-15 LAB — WET PREP, GENITAL
Trich, Wet Prep: NONE SEEN
Yeast Wet Prep HPF POC: NONE SEEN

## 2014-06-15 LAB — CBC
HCT: 35.2 % — ABNORMAL LOW (ref 36.0–46.0)
Hemoglobin: 12.3 g/dL (ref 12.0–15.0)
MCH: 30.5 pg (ref 26.0–34.0)
MCHC: 34.9 g/dL (ref 30.0–36.0)
MCV: 87.3 fL (ref 78.0–100.0)
PLATELETS: 212 10*3/uL (ref 150–400)
RBC: 4.03 MIL/uL (ref 3.87–5.11)
RDW: 13.4 % (ref 11.5–15.5)
WBC: 5.8 10*3/uL (ref 4.0–10.5)

## 2014-06-15 LAB — ABO/RH: ABO/RH(D): O POS

## 2014-06-15 NOTE — Progress Notes (Signed)
Subjective:    Christine Ross is being seen today for her first obstetrical visit.  This is not a planned pregnancy. She is at [redacted]w[redacted]d gestation. Her obstetrical history is significant for none. Relationship with FOB: significant other, not living together. Patient does intend to breast feed. Pregnancy history fully reviewed.  The information documented in the HPI was reviewed and verified.  Menstrual History: OB History    Gravida Para Term Preterm AB TAB SAB Ectopic Multiple Living         Patient's last menstrual period was 03/31/2014.    Past Medical History  Diagnosis Date  . Insomnia   . Depression     age 49yo, resolved  . Wears glasses   . Routine gynecological examination     Dr. Jean Rosenthal  . Eczema     Past Surgical History  Procedure Laterality Date  . Wisdom tooth extraction       (Not in a hospital admission) Allergies  Allergen Reactions  . Nickel     History  Substance Use Topics  . Smoking status: Former Smoker    Types: Cigars  . Smokeless tobacco: Not on file  . Alcohol Use: No     Comment: social    History reviewed. No pertinent family history.   Review of Systems Constitutional: negative for weight loss Gastrointestinal: negative for vomiting Genitourinary:negative for genital lesions and vaginal discharge and dysuria Musculoskeletal:negative for back pain Behavioral/Psych: negative for abusive relationship, depression, illegal drug usage and tobacco use    Objective:    BP 124/77 mmHg  Pulse 85  Temp(Src) 97.8 F (36.6 C)  Wt 134 lb (60.782 kg)  LMP 03/31/2014 General Appearance:    Alert, cooperative, no distress, appears stated age  Head:    Normocephalic, without obvious abnormality, atraumatic  Eyes:    PERRL, conjunctiva/corneas clear, EOM's intact, fundi    benign, both eyes  Ears:    Normal TM's and external ear canals, both ears  Nose:   Nares normal, septum midline, mucosa normal, no drainage    or  sinus tenderness  Throat:   Lips, mucosa, and tongue normal; teeth and gums normal  Neck:   Supple, symmetrical, trachea midline, no adenopathy;    thyroid:  no enlargement/tenderness/nodules; no carotid   bruit or JVD  Back:     Symmetric, no curvature, ROM normal, no CVA tenderness  Lungs:     Clear to auscultation bilaterally, respirations unlabored  Chest Wall:    No tenderness or deformity   Heart:    Regular rate and rhythm, S1 and S2 normal, no murmur, rub   or gallop  Breast Exam:    No tenderness, masses, or nipple abnormality  Abdomen:     Soft, non-tender, bowel sounds active all four quadrants,    no masses, no organomegaly  Genitalia:    Normal female without lesion, discharge or tenderness  Extremities:   Extremities normal, atraumatic, no cyanosis or edema  Pulses:   2+ and symmetric all extremities  Skin:   Skin color, texture, turgor normal, no rashes or lesions  Lymph nodes:   Cervical, supraclavicular, and axillary nodes normal  Neurologic:   CNII-XII intact, normal strength, sensation and reflexes    throughout      Lab Review Urine pregnancy test Labs reviewed yes Radiologic studies reviewed no Assessment:    Pregnancy at [redacted]w[redacted]d weeks    Absent fetal heart tones with Doppler  [facility]   [facility].    Prenatal vitamins.  Counseling provided regarding continued use of seat belts, cessation of alcohol consumption, smoking or use of illicit drugs; infection precautions i.e., influenza/TDAP immunizations, toxoplasmosis,CMV, parvovirus, listeria and varicella; workplace safety, exercise during pregnancy; routine dental care, safe medications, sexual activity, hot tubs, saunas, pools, travel, caffeine use, fish and methlymercury, potential toxins, hair treatments, varicose veins Weight gain recommendations per IOM guidelines reviewed: underweight/BMI< 18.5--> gain 28 - 40 lbs; normal weight/BMI 18.5 - 24.9--> gain 25 - 35  lbs; overweight/BMI 25 - 29.9--> gain 15 - 25 lbs; obese/BMI >30->gain  11 - 20 lbs Problem list reviewed and updated. FIRST/CF mutation testing/NIPT/QUAD SCREEN/fragile X/Ashkenazi Jewish population testing/Spinal muscular atrophy discussed: requested. Role of ultrasound in pregnancy discussed; fetal survey: requested. Amniocentesis discussed: not indicated. VBAC calculator score: VBAC consent form provided No orders of the defined types were placed in this encounter.   Orders Placed This Encounter  Procedures  . Culture, OB Urine  . SureSwab, Vaginosis/Vaginitis Plus  . Obstetric panel  . HIV antibody  . Varicella zoster antibody, IgG  . Hemoglobinopathy evaluation  . Vit D  25 hydroxy (rtn osteoporosis monitoring)  . POCT urinalysis dipstick    Follow up in 4 weeks.

## 2014-06-15 NOTE — MAU Provider Note (Signed)
Chief Complaint: Abnormal Prenatal Screening   First Provider Initiated Contact with Patient 06/15/14 1404     SUBJECTIVE HPI: Christine Ross is a 24 y.o. G1P0000 at 3359w6d by LMP [facility] no fetal HR was seen or heard with U/S. Pt denies pain or bleeding at this time. No pelvic exam were blood work done in the office. No ultrasounds or lab work done this pregnancy so far.  Past Medical History  Diagnosis Date  . Insomnia   . Depression     age 24yo, resolved  . Wears glasses   . Routine gynecological examination     Dr. Jean RosenthalJackson  . Eczema   . Chlamydia contact, treated 02-2014   OB History  Gravida Para Term Preterm AB SAB TAB Ectopic Multiple Living  1 0 0 0 0 0 0 0 0 0     # Outcome Date GA Lbr Len/2nd Weight Sex Delivery Anes PTL Lv  1 Current              Past Surgical History  Procedure Laterality Date  . Wisdom tooth extraction     History   Social History  . Marital Status: Single    Spouse Name: N/A    Number of Children: N/A  . Years of Education: N/A   Occupational History  . Not on file.   Social History Main Topics  . Smoking status: Former Smoker    Types: Cigars  . Smokeless tobacco: Not on file  . Alcohol Use: No     Comment: social  . Drug Use: No  . Sexual Activity: Yes    Birth Control/ Protection: None   Other Topics Concern  . Not on file   Social History Narrative   Single, no prior pregnancies, in [facility] justice [facility] [employer]   No current facility-administered medications on file prior to encounter.   No current outpatient prescriptions on file prior to encounter.   Allergies  Allergen Reactions  . Nickel     ROS: Pertinent items in HPI  OBJECTIVE Blood pressure 115/75, pulse 72, temperature 98.9 F (37.2 C), resp. rate 16, height 5' (1.524 m), last menstrual period 03/31/2014, SpO2 100 %. GENERAL: Well-developed, well-nourished female in no acute distress.  HEENT:  Normocephalic HEART: normal rate RESP: normal effort ABDOMEN: Soft, non-tender EXTREMITIES: Nontender, no edema NEURO: Alert and oriented SPECULUM EXAM: NEFG, small amount of thin, white, odorless discharge, no blood noted, cervix clean BIMANUAL: cervix closed; uterus retroverted, ~10 week size, no adnexal tenderness or masses  LAB RESULTS Results for orders placed or performed during the hospital encounter of 06/15/14 (from the past 24 hour(s))  Wet prep, genital     Status: Abnormal   Collection Time: 06/15/14  2:05 PM  Result Value Ref Range   Yeast Wet Prep HPF POC NONE SEEN NONE SEEN   Trich, Wet Prep NONE SEEN NONE SEEN   Clue Cells Wet Prep HPF POC FEW (A) NONE SEEN   WBC, Wet Prep HPF POC FEW (A) NONE SEEN  CBC     Status: Abnormal   Collection Time: 06/15/14  2:35 PM  Result Value Ref Range   WBC 5.8 4.0 - 10.5 K/uL   RBC 4.03 3.87 - 5.11 MIL/uL   Hemoglobin 12.3 12.0 - 15.0 g/dL   HCT 40.935.2 (L) 81.136.0 - 91.446.0 %   MCV 87.3 78.0 - 100.0 fL   MCH 30.5 26.0 - 34.0 pg   MCHC 34.9 30.0 -  36.0 g/dL   RDW 16.1 09.6 - 04.5 %   Platelets 212 150 - 400 K/uL    IMAGING US Ob Comp Less 14 Wks  06/15/2014   CLINICAL DATA:  First trimester pregnancy with uncertain fetal viability. No audible fetal heart tones on Doppler.  EXAM: OBSTETRIC <14 WK [place] AND TRANSVAGINAL OB US  TECHNIQUE: Both transabdominal and transvaginal ultrasound examinations were performed for complete evaluation of the gestation as well as the maternal uterus, adnexal regions, and pelvic cul-de-sac. Transvaginal technique was performed to assess early pregnancy.  COMPARISON:  None.  FINDINGS: Intrauterine gestational sac: Visualized/normal in shape.  Yolk sac:  Visualized  Embryo:  Visualized  Cardiac Activity: Absent  CRL:   23  mm   9 w 0 d                  [place] EDC: 01/18/2015  Maternal uterus/adnexae: Both ovaries are normal in appearance. No mass or free fluid identified.  IMPRESSION: Findings meet definitive criteria  for failed pregnancy. This follows SRU consensus guidelines: Diagnostic Criteria for Nonviable Pregnancy Early in the First Trimester. [facility] [phone].   Electronically Signed   By: Myles Rosenthal M.D.   On: 06/15/2014 13:48   US Ob Transvaginal  06/15/2014   CLINICAL DATA:  First trimester pregnancy with uncertain fetal viability. No audible fetal heart tones on Doppler.  EXAM: OBSTETRIC <14 WK [place] AND TRANSVAGINAL OB US  TECHNIQUE: Both transabdominal and transvaginal ultrasound examinations were performed for complete evaluation of the gestation as well as the maternal uterus, adnexal regions, and pelvic cul-de-sac. Transvaginal technique was performed to assess early pregnancy.  COMPARISON:  None.  FINDINGS: Intrauterine gestational sac: Visualized/normal in shape.  Yolk sac:  Visualized  Embryo:  Visualized  Cardiac Activity: Absent  CRL:   23  mm   9 w 0 d                  [place] EDC: 01/18/2015  Maternal uterus/adnexae: Both ovaries are normal in appearance. No mass or free fluid identified.  IMPRESSION: Findings meet definitive criteria for failed pregnancy. This follows SRU consensus guidelines: Diagnostic Criteria for Nonviable Pregnancy Early in the First Trimester. [facility] J [facility] [phone].   Electronically Signed   By: Myles Rosenthal M.D.   On: 06/15/2014 13:48    MAU COURSE  ASSESSMENT 1. Missed abortion    PLAN Discharge home in stable condition per consult with Dr. Clearance Coots. Support given. Declined chaplain visit. Handout with management options given to patient. Patient was upset to discuss. GC/Chlamydia cultures pending.     Follow-up Information    Follow up with [facility] Call in 1 week.   Specialty:  Obstetrics and Gynecology   Contact information:   [address] [place] [address] [phone]      Follow up with [facility]  MATERNITY ADMISSIONS.   Why:  As needed in emergencies   Contact  information:   [address] [phone] mc [place] [address] [phone]       Medication List    TAKE these medications        prenatal multivitamin Tabs tablet  Take 1 tablet by mouth daily at 12 noon.         [place], CNM 06/15/2014  3:11 PM

## 2014-06-15 NOTE — MAU Note (Signed)
Pt sent from office no fetal HR seen or heard with U/S. Pt denies pain or bleeding at this time.

## 2014-06-15 NOTE — Discharge Instructions (Signed)
Incomplete Miscarriage °A miscarriage is the sudden loss of an unborn baby (fetus) before the 20th week of pregnancy. In an incomplete miscarriage, parts of the fetus or placenta (afterbirth) remain in the body.  °Having a miscarriage can be an emotional experience. Talk with your health care provider about any questions you may have about miscarrying, the grieving process, and your future pregnancy plans. °CAUSES  °· Problems with the fetal chromosomes that make it impossible for the baby to develop normally. Problems with the baby's genes or chromosomes are most often the result of errors that occur by chance as the embryo divides and grows. The problems are not inherited from the parents. °· Infection of the cervix or uterus. °· Hormone problems. °· Problems with the cervix, such as having an incompetent cervix. This is when the tissue in the cervix is not strong enough to hold the pregnancy. °· Problems with the uterus, such as an abnormally shaped uterus, uterine fibroids, or congenital abnormalities. °· Certain medical conditions. °· Smoking, drinking alcohol, or taking illegal drugs. °· Trauma. °SYMPTOMS  °· Vaginal bleeding or spotting, with or without cramps or pain. °· Pain or cramping in the abdomen or lower back. °· Passing fluid, tissue, or blood clots from the vagina. °DIAGNOSIS  °Your health care provider will perform a physical exam. You may also have an ultrasound to confirm the miscarriage. Blood or urine tests may also be ordered. °TREATMENT  °· Usually, a dilation and curettage (D&C) procedure is performed. During a D&C procedure, the cervix is widened (dilated) and any remaining fetal or placental tissue is gently removed from the uterus. °· Antibiotic medicines are prescribed if there is an infection. Other medicines may be given to reduce the size of the uterus (contract) if there is a lot of bleeding. °· If you have Rh negative blood and your baby was Rh positive, you will need a Rho (D)  immune globulin shot. This shot will protect any future baby from having Rh blood problems in future pregnancies. °· You may be confined to bed rest. This means you should stay in bed and only get up to use the bathroom. °HOME CARE INSTRUCTIONS  °· Rest as directed by your health care provider. °· Restrict activity as directed by your health care provider. You may be allowed to continue light activity if curettage was not done but you require further treatment. °· Keep track of the number of pads you use each day. Keep track of how soaked (saturated) they are. Record this information. °· Do not  use tampons. °· Do not douche or have sexual intercourse until approved by your health care provider. °· Keep all follow-up appointments for reevaluation and continuing management. °· Only take over-the-counter or prescription medicines for pain, fever, or discomfort as directed by your health care provider. °· Take antibiotic medicine as directed by your health care provider. Make sure you finish it even if you start to feel better. °SEEK IMMEDIATE MEDICAL CARE IF:  °· You experience severe cramps in your stomach, back, or abdomen. °· You have an unexplained temperature (make sure to record these temperatures). °· You pass large clots or tissue (save these for your health care provider to inspect). °· Your bleeding increases. °· You become light-headed, weak, or have fainting episodes. °MAKE SURE YOU:  °· Understand these instructions. °· Will watch your condition. °· Will get help right away if you are not doing well or get worse. °Document Released: 05/12/2005 Document Revised: 09/26/2013 Document Reviewed:   12/09/2012 °[employer]® Patient Information ©2015 [employer]. This information is not intended to replace advice given to you by your health care provider. Make sure you discuss any questions you have with your health care provider. °FACTS YOU SHOULD KNOW ° °WHAT IS AN EARLY PREGNANCY FAILURE? °Once the egg is  fertilized with the sperm and begins to develop, it attaches to the lining of the uterus. This early pregnancy tissue may not develop into an embryo (the beginning stage of a baby). Sometimes an embryo does develop but does not continue to grow. These problems can be seen on ultrasound.  ° °MANAGEMNT OF EARLY PREGNANCY FAILURE: °About 4 out of 100 (0.25%) women will have a pregnancy loss in her lifetime.  One in five pregnancies is found to be an early pregnancy failure.  There are 3 ways to care for an early pregnancy failure:   °(1) Surgery, (2) Medicine, (3) Waiting for you to pass the pregnancy on your own. °The decision as to how to proceed after being diagnosed with and early pregnancy failure is an individual one.  The decision can be made only after appropriate counseling.  You need to weigh the pros and cons of the 3 choices. Then you can make the choice that works for you. °SURGERY (D&E) °• Procedure over in 1 day °• Requires being put to sleep °• Bleeding may be light °• Possible problems during surgery, including injury to womb(uterus) °• Care provider has more control °Medicine (CYTOTEC) °• The complete procedure may take days to weeks °• No Surgery °• Bleeding may be heavy at times °• There may be drug side effects °• Patient has more control °Waiting °• You may choose to wait, in which case your own body may complete the passing of the abnormal early pregnancy on its own in about 2-4 weeks °• Your bleeding may be heavy at times °• There is a small possibility that you may need surgery if the bleeding is too much or not all of the pregnancy has passed. °CYTOTEC MANAGEMENT °Prostaglandins (cytotec) are the most widely used drug for this purpose. They cause the uterus to cramp and contract. You will place the medicine yourself inside your vagina in the privacy of your home. Empting of the uterus should occur within 3 days but the process may continue for several weeks. The bleeding may seem heavy at  times. °POSSIBLE SIDE EFFECTS FROM CYTOTEC °• Nausea   Vomiting °• Diarrhea Fever °• Chills  Hot Flashes °Side effects  from the process of the early pregnancy failure include: °• Cramping  Bleeding °• Headaches  Dizziness °RISKS: °This is a low risk procedure. Less than 1 in 100 women has a complication. An incomplete passage of the early pregnancy may occur. Also, Hemorrhage (heavy bleeding) could happen.  Rarely the pregnancy will not be passed completely. Excessively heavy bleeding may occur.  Your doctor may need to perform surgery to empty the uterus (D&E). °Afterwards: °Everybody will feel differently after the early pregnancy completion. You may have soreness or cramps for a day or two. You may have soreness or cramps for day or two.  You may have light bleeding for up to 2 weeks. You may be as active as you feel like being. °If you have any of the following problems you may call [facility] at [phone]. °• If you have pain that does not get better  with pain medication °• Bleeding that soaks through 2 thick full-sized sanitary pads in an hour °•   Cramps that last longer than 2 days °• Foul smelling discharge °• Fever above 100.4 degrees F °Even if you do not have any of these symptoms, you should have a follow-up exam to make sure you are healing properly. This appointment will be made for you before you leave the hospital. Your next normal period will start again in 4-6 week after the loss. You can get pregnant soon after the loss, so use birth control right away. °Finally: °Make sure all your questions are answered before during and after any procedure. Follow up with medical care and family planning methods. ° ° ° °

## 2014-06-16 LAB — GC/CHLAMYDIA PROBE AMP (~~LOC~~) NOT AT ARMC
CHLAMYDIA, DNA PROBE: NEGATIVE
Neisseria Gonorrhea: NEGATIVE

## 2014-06-17 LAB — HIV ANTIBODY (ROUTINE TESTING W REFLEX): HIV 1/HIV 2 AB: NONREACTIVE

## 2014-06-19 ENCOUNTER — Telehealth: Payer: Self-pay | Admitting: *Deleted

## 2014-06-19 ENCOUNTER — Encounter: Payer: Self-pay | Admitting: *Deleted

## 2014-06-19 ENCOUNTER — Encounter (HOSPITAL_COMMUNITY): Payer: Self-pay | Admitting: *Deleted

## 2014-06-19 ENCOUNTER — Inpatient Hospital Stay (HOSPITAL_COMMUNITY)
Admission: AD | Admit: 2014-06-19 | Discharge: 2014-06-19 | Disposition: A | Discharge status: Home / Self Care | Payer: BC Managed Care – PPO | Source: Ambulatory Visit | Attending: Obstetrics | Admitting: Obstetrics

## 2014-06-19 DIAGNOSIS — O021 Missed abortion: Secondary | ICD-10-CM | POA: Diagnosis present

## 2014-06-19 DIAGNOSIS — Z87891 Personal history of nicotine dependence: Secondary | ICD-10-CM | POA: Insufficient documentation

## 2014-06-19 LAB — CBC
HCT: 36.7 % (ref 36.0–46.0)
Hemoglobin: 13.1 g/dL (ref 12.0–15.0)
MCH: 31.2 pg (ref 26.0–34.0)
MCHC: 35.7 g/dL (ref 30.0–36.0)
MCV: 87.4 fL (ref 78.0–100.0)
Platelets: 218 10*3/uL (ref 150–400)
RBC: 4.2 MIL/uL (ref 3.87–5.11)
RDW: 13.5 % (ref 11.5–15.5)
WBC: 5.6 10*3/uL (ref 4.0–10.5)

## 2014-06-19 MED ORDER — HYDROCODONE-ACETAMINOPHEN 5-325 MG PO TABS: 1.0000 | ORAL_TABLET | ORAL | Status: DC | PRN

## 2014-06-19 MED ORDER — IBUPROFEN 600 MG PO TABS: 600.0000 mg | ORAL_TABLET | Freq: Four times a day (QID) | ORAL | Status: DC | PRN

## 2014-06-19 MED ORDER — MISOPROSTOL 200 MCG PO TABS: 800.0000 ug | ORAL_TABLET | Freq: Once | ORAL | Status: DC

## 2014-06-19 MED ORDER — PROMETHAZINE HCL 25 MG PO TABS: 25.0000 mg | ORAL_TABLET | Freq: Four times a day (QID) | ORAL | Status: DC | PRN

## 2014-06-19 NOTE — Discharge Instructions (Signed)
°  What to expect after the misoprostol  Cramping, moderate to heavy bleeding, and moderate pain are normal parts of the abortion process as your uterus passes the pregnancy.  Cramping usually starts one to four hours after you place the misoprostol in your vagina. Bleeding usually starts between 30 minutes to four hours after you place the misoprostol in your vagina. However, it can take up to 24 hours for some women. Heavy bleeding and strong cramps usually last between one and four hours. Call the clinic if you soak through more than two large maxi-pads per hour for two hours in a row. For pain: Resting and using a heating pad or hot water bottle may help. Both Vicodin and ibuprofen usually help with the pain. You may use either one or both together. Call if your pain is not manageable with Vicodin and ibuprofen.  Vicodin: You may take one or two tablets of Vicodin every four to six hours. You may take the first pill as soon as you feel you need something for the pain. Vicodin may make you feel sleepy, nauseated, or dizzy. Do not exceed this dose. Ibuprofen (Motrin, Advil, etc.): You may take 800mg  of ibuprofen (four over-the-counter tablets) every six to eight hours. You may take the first dose of pills as soon as you feel you need something for the pain. Ibuprofen usually does not cause side effects such as sleepiness, nausea, or dizziness. Other misoprostol side effects:  The following side effects are not dangerous and usually only last one to four hours. If any of these side effects make you very uncomfortable, you may treat your symptoms with over-the-counter medicines. Call if over-the-counter medicines do not relieve your symptoms.  Nausea or vomiting  Call if you vomit several times and cannot hold down liquids. Over the counter treatment: Benadryl 25mg  to 50mg  every six hours as needed, or Dramamine 25mg  to 50mg  every six hours as needed. Diarrhea  Call if you have several episodes  of diarrhea lasting more than four to five hours and you are having trouble drinking liquids (you may be getting dehydrated). Over the counter treatment: Imodium 2 tablets (4mg ) once. Fever, chills  Misoprostol may cause fever or chills in the first 24 hours. After 24 hours, fever or chills may be a sign of an infection and you should call the clinic. If you already took Tylenol, Vicodin (which has 500mg  of Tylenol in it), or ibuprofen for pain, do not take more of the same medication for fever or chills. Call the clinic if your fever is higher than 101 F (or 39 C). Over the counter treatment: Tylenol 500 to 650mg  every four hours, Ibuprofen 800mg  (four over-the-counter pills) every six to eight hours. How to tell if the abortion is complete  Most women have bleeding and painful cramping. As you pass the pregnancy, the bleeding is usually heavy and the cramping very strong. This usually lasts one to four hours. Most women pass some blood clots in the toilet and the pregnancy is often one of those clots. After the pregnancy passes, the cramps decrease and the bleeding slows down significantly.  Within a few hours after passing the pregnancy, cramps and bleeding should be much improved

## 2014-06-19 NOTE — [facility] Note (Addendum)
Sent from office.  Dx with missed AB.  Denies pain or bleeding.  "didn't want it to start or happen when at work, so thought I would just get the medicine.Pt was actually in Dr. Verdell CarmineHarper's office on Thurs.  Pt states he was waiting to see if things past on it's own.   Pt called office and was informed to come to [facility] for medication.

## 2014-06-19 NOTE — Telephone Encounter (Signed)
Patient reports she has had a missed AB diagnosed on 06/15/2014. Patient reports she has had no bleeding since diagnoses and she needs to go back to work. She would like medication to take so she can end things and not have to deal with bleeding at work. Dr Clearance CootsHarper is to call [facility] to see what the process is and will call patient back.

## 2014-06-19 NOTE — [facility] Provider Note (Signed)
History     CSN: [id number]  Arrival date and time: 06/19/14 1203   First Provider Initiated Contact with Patient 06/19/14 1250      Chief Complaint  Patient presents with  . Miscarriage   HPI   Ms. Avneet Ashmore is a 24 y.o. female G1P0000 at [redacted]w[redacted]d who presents from Dr. Thomes Lolling office for cytotec. She was diagnosed with a failed pregnancy here in [facility] 3 days ago. She was too upset to discuss options at that time. She called Dr. Verdell Carmine office today and informed him that she wanted cytotec to aid in the process of passing the fetus.   She denies pain or bleeding   Dr. Clearance Coots called prior to patients arrival and notified NP that patient was coming to [facility] for Cytotec.   OB History    Gravida Para Term Preterm AB TAB SAB Ectopic Multiple Living        Past Medical History  Diagnosis Date  . Insomnia   . Depression     age 53yo, resolved  . Wears glasses   . Routine gynecological examination     Dr. Jean Rosenthal  . Eczema   . Chlamydia contact, treated 02-2014    Past Surgical History  Procedure Laterality Date  . Wisdom tooth extraction      History reviewed. No pertinent family history.  History  Substance Use Topics  . Smoking status: Former Smoker    Types: Cigars  . Smokeless tobacco: Not on file  . Alcohol Use: No     Comment: social    Allergies:  Allergies  Allergen Reactions  . Nickel     Prescriptions prior to admission  Medication Sig Dispense Refill Last Dose  . Prenatal Vit-Fe Fumarate-FA (PRENATAL MULTIVITAMIN) TABS tablet Take 1 tablet by mouth daily at 12 noon.   06/14/2014 at Unknown time   Results for orders placed or performed during the hospital encounter of 06/19/14 (from the past 48 hour(s))  CBC     Status: None   Collection Time: 06/19/14  1:12 PM  Result Value Ref Range   WBC 5.6 4.0 - 10.5 K/uL   RBC 4.20 3.87 - 5.11 MIL/uL   Hemoglobin 13.1 12.0 - 15.0 g/dL   HCT 40.9 81.1 - 91.4 %   MCV 87.4 78.0 -  100.0 fL   MCH 31.2 26.0 - 34.0 pg   MCHC 35.7 30.0 - 36.0 g/dL   RDW 78.2 95.6 - 21.3 %   Platelets 218 150 - 400 K/uL   CLINICAL DATA: First trimester pregnancy with uncertain fetal viability. No audible fetal heart tones on Doppler.  EXAM: OBSTETRIC <14 WK [place] AND TRANSVAGINAL OB US  TECHNIQUE: Both transabdominal and transvaginal ultrasound examinations were performed for complete evaluation of the gestation as well as the maternal uterus, adnexal regions, and pelvic cul-de-sac. Transvaginal technique was performed to assess early pregnancy.  COMPARISON: None.  FINDINGS: Intrauterine gestational sac: Visualized/normal in shape.  Yolk sac: Visualized  Embryo: Visualized  Cardiac Activity: Absent  CRL: 23 mm 9 w 0 d [place] EDC: 01/18/2015  Maternal uterus/adnexae: Both ovaries are normal in appearance. No mass or free fluid identified.  IMPRESSION: Findings meet definitive criteria for failed pregnancy. This follows SRU consensus guidelines: Diagnostic Criteria for Nonviable Pregnancy Early in the First Trimester. [facility] [phone].   Electronically Signed  By: Myles Rosenthal M.D.  On: 06/15/2014 13:48  Review of Systems  Constitutional: Negative for  fever and chills.  Gastrointestinal: Negative for nausea, vomiting, abdominal pain, diarrhea and constipation.  Genitourinary: Negative for dysuria.       Denies vaginal bleeding    Physical Exam   Height 5\' 1"  (1.549 m), weight 60.328 kg (133 lb), last menstrual period 03/31/2014.  Physical Exam  Constitutional: She is oriented to person, place, and time. She appears well-developed and well-nourished. No distress.  HENT:  Head: Normocephalic.  Eyes: Pupils are equal, round, and reactive to light.  Neck: Neck supple.  Respiratory: Effort normal.  GI: Soft. There is no tenderness.  Musculoskeletal: Normal range of motion.  Neurological: She is alert and oriented  to person, place, and time.  Skin: Skin is warm and dry. She is not diaphoretic.  Psychiatric: Her behavior is normal.    [facility] Course  Procedures  None  MDM  O positive blood type  Cytotec administration discussed at length including the risk of bleeding and increased pain. Patient voices understanding and consents to administering cytotec at home.  CBC   Assessment and Plan   Assessment:  Missed AB   Plan:  Discharge home in stable condition -RX:  -Cytotec -Phenergan  -Vicodin  -ibuprofen   Bleeding precautions  Follow up with Dr. Clearance CootsHarper; call to schedule a follow up appointment Return to [facility]U as needed, if symptoms worsen Support given   Iona HansenJennifer Irene Tyshia Fenter, NP 06/19/2014 1:46 PM

## 2014-06-27 ENCOUNTER — Ambulatory Visit: Payer: BC Managed Care – PPO | Admitting: Obstetrics

## 2014-06-28 ENCOUNTER — Inpatient Hospital Stay (HOSPITAL_COMMUNITY)
Admission: AD | Admit: 2014-06-28 | Discharge: 2014-06-28 | Disposition: A | Discharge status: Home / Self Care | Payer: BC Managed Care – PPO | Source: Ambulatory Visit | Attending: Obstetrics | Admitting: Obstetrics

## 2014-06-28 ENCOUNTER — Other Ambulatory Visit: Payer: Self-pay | Admitting: *Deleted

## 2014-06-28 ENCOUNTER — Inpatient Hospital Stay (HOSPITAL_COMMUNITY): Payer: BC Managed Care – PPO

## 2014-06-28 ENCOUNTER — Encounter (HOSPITAL_COMMUNITY): Payer: Self-pay | Admitting: *Deleted

## 2014-06-28 DIAGNOSIS — O021 Missed abortion: Secondary | ICD-10-CM | POA: Insufficient documentation

## 2014-06-28 DIAGNOSIS — Z87891 Personal history of nicotine dependence: Secondary | ICD-10-CM | POA: Insufficient documentation

## 2014-06-28 DIAGNOSIS — R109 Unspecified abdominal pain: Secondary | ICD-10-CM | POA: Diagnosis present

## 2014-06-28 DIAGNOSIS — Z8759 Personal history of other complications of pregnancy, childbirth and the puerperium: Secondary | ICD-10-CM

## 2014-06-28 LAB — CBC
HCT: 28.7 % — ABNORMAL LOW (ref 36.0–46.0)
Hemoglobin: 10.2 g/dL — ABNORMAL LOW (ref 12.0–15.0)
MCH: 31.4 pg (ref 26.0–34.0)
MCHC: 35.5 g/dL (ref 30.0–36.0)
MCV: 88.3 fL (ref 78.0–100.0)
PLATELETS: 203 10*3/uL (ref 150–400)
RBC: 3.25 MIL/uL — AB (ref 3.87–5.11)
RDW: 13.5 % (ref 11.5–15.5)
WBC: 9 10*3/uL (ref 4.0–10.5)

## 2014-06-28 LAB — HCG, QUANTITATIVE, PREGNANCY: HCG, BETA CHAIN, QUANT, S: 1024 m[IU]/mL — AB (ref <5)

## 2014-06-28 MED ORDER — HYDROCODONE-ACETAMINOPHEN 5-325 MG PO TABS: 1.0000 | ORAL_TABLET | ORAL | Status: DC | PRN

## 2014-06-28 MED ORDER — OXYCODONE-ACETAMINOPHEN 5-325 MG PO TABS
2.0000 | ORAL_TABLET | ORAL | Status: DC | PRN
  Administered 2014-06-28: 2 via ORAL
  Filled 2014-06-28: qty 2

## 2014-06-28 MED ORDER — OXYCODONE-ACETAMINOPHEN 5-325 MG PO TABS: 2.0000 | ORAL_TABLET | ORAL | Status: DC | PRN

## 2014-06-28 MED ORDER — DOXYCYCLINE HYCLATE 100 MG PO TABS
200.0000 mg | ORAL_TABLET | Freq: Once | ORAL | Status: AC
  Administered 2014-06-28: 200 mg via ORAL
  Filled 2014-06-28: qty 2

## 2014-06-28 MED ORDER — CEFOTETAN DISODIUM 2 G IJ SOLR
2.0000 g | INTRAMUSCULAR | Status: AC
  Administered 2014-06-29: 2 g via INTRAVENOUS
  Filled 2014-06-28: qty 2

## 2014-06-28 MED ORDER — METRONIDAZOLE 500 MG PO TABS
500.0000 mg | ORAL_TABLET | Freq: Once | ORAL | Status: AC
  Administered 2014-06-28: 500 mg via ORAL
  Filled 2014-06-28: qty 1

## 2014-06-28 NOTE — Discharge Instructions (Signed)
Miscarriage °A miscarriage is the sudden loss of an unborn baby (fetus) before the 20th week of pregnancy. Most miscarriages happen in the first 3 months of pregnancy. Sometimes, it happens before a woman even knows she is pregnant. A miscarriage is also called a "spontaneous miscarriage" or "early pregnancy loss." Having a miscarriage can be an emotional experience. Talk with your caregiver about any questions you may have about miscarrying, the grieving process, and your future pregnancy plans. °CAUSES  °· Problems with the fetal chromosomes that make it impossible for the baby to develop normally. Problems with the baby's genes or chromosomes are most often the result of errors that occur, by chance, as the embryo divides and grows. The problems are not inherited from the parents. °· Infection of the cervix or uterus.   °· Hormone problems.   °· Problems with the cervix, such as having an incompetent cervix. This is when the tissue in the cervix is not strong enough to hold the pregnancy.   °· Problems with the uterus, such as an abnormally shaped uterus, uterine fibroids, or congenital abnormalities.   °· Certain medical conditions.   °· Smoking, drinking alcohol, or taking illegal drugs.   °· Trauma.   °Often, the cause of a miscarriage is unknown.  °SYMPTOMS  °· Vaginal bleeding or spotting, with or without cramps or pain. °· Pain or cramping in the abdomen or lower back. °· Passing fluid, tissue, or blood clots from the vagina. °DIAGNOSIS  °Your caregiver will perform a physical exam. You may also have an ultrasound to confirm the miscarriage. Blood or urine tests may also be ordered. °TREATMENT  °· Sometimes, treatment is not necessary if you naturally pass all the fetal tissue that was in the uterus. If some of the fetus or placenta remains in the body (incomplete miscarriage), tissue left behind may become infected and must be removed. Usually, a dilation and curettage (D and C) procedure is performed.  During a D and C procedure, the cervix is widened (dilated) and any remaining fetal or placental tissue is gently removed from the uterus. °· Antibiotic medicines are prescribed if there is an infection. Other medicines may be given to reduce the size of the uterus (contract) if there is a lot of bleeding. °· If you have Rh negative blood and your baby was Rh positive, you will need a Rh immunoglobulin shot. This shot will protect any future baby from having Rh blood problems in future pregnancies. °HOME CARE INSTRUCTIONS  °· Your caregiver may order bed rest or may allow you to continue light activity. Resume activity as directed by your caregiver. °· Have someone help with home and family responsibilities during this time.   °· Keep track of the number of sanitary pads you use each day and how soaked (saturated) they are. Write down this information.   °· Do not use tampons. Do not douche or have sexual intercourse until approved by your caregiver.   °· Only take over-the-counter or prescription medicines for pain or discomfort as directed by your caregiver.   °· Do not take aspirin. Aspirin can cause bleeding.   °· Keep all follow-up appointments with your caregiver.   °· If you or your partner have problems with grieving, talk to your caregiver or seek counseling to help cope with the pregnancy loss. Allow enough time to grieve before trying to get pregnant again.   °SEEK IMMEDIATE MEDICAL CARE IF:  °· You have severe cramps or pain in your back or abdomen. °· You have a fever. °· You pass large blood clots (walnut-sized   or larger) ortissue from your vagina. Save any tissue for your caregiver to inspect.   Your bleeding increases.   You have a thick, bad-smelling vaginal discharge.  You become lightheaded, weak, or you faint.   You have chills.  MAKE SURE YOU:  Understand these instructions.  Will watch your condition.  Will get help right away if you are not doing well or get  worse. Document Released: 11/05/2000 Document Revised: 09/06/2012 Document Reviewed: 07/01/2011 [facility] Patient Information 2015 [place], [place]. This information is not intended to replace advice given to you by your health care provider. Make sure you discuss any questions you have with your health care provider. Dilation and Curettage or Vacuum Curettage Dilation and curettage (D&C) and vacuum curettage are minor procedures. A D&C involves stretching (dilation) the cervix and scraping (curettage) the inside lining of the womb (uterus). During a D&C, tissue is gently scraped from the inside lining of the uterus. During a vacuum curettage, the lining and tissue in the uterus are removed with the use of gentle suction.  Curettage may be performed to either diagnose or treat a problem. As a diagnostic procedure, curettage is performed to examine tissues from the uterus. A diagnostic curettage may be performed for the following symptoms:   Irregular bleeding in the uterus.   Bleeding with the development of clots.   Spotting between menstrual periods.   Prolonged menstrual periods.   Bleeding after menopause.   No menstrual period (amenorrhea).   A change in size and shape of the uterus.  As a treatment procedure, curettage may be performed for the following reasons:   Removal of an IUD (intrauterine device).   Removal of retained placenta after giving birth. Retained placenta can cause an infection or bleeding severe enough to require transfusions.   Abortion.   Miscarriage.   Removal of polyps inside the uterus.   Removal of uncommon types of noncancerous lumps (fibroids).  LET [facility] CARE PROVIDER KNOW ABOUT:   Any allergies you have.   All medicines you are taking, including vitamins, herbs, eye drops, creams, and over-the-counter medicines.   Previous problems you or members of your family have had with the use of anesthetics.   Any blood disorders  you have.   Previous surgeries you have had.   Medical conditions you have. RISKS AND COMPLICATIONS  Generally, this is a safe procedure. However, as with any procedure, complications can occur. Possible complications include:  Excessive bleeding.   Infection of the uterus.   Damage to the cervix.   Development of scar tissue (adhesions) inside the uterus, later causing abnormal amounts of menstrual bleeding.   Complications from the general anesthetic, if a general anesthetic is used.   Putting a hole (perforation) in the uterus. This is rare.  BEFORE THE PROCEDURE   Eat and drink before the procedure only as directed by your health care provider.   Arrange for someone to take you home.  PROCEDURE  This procedure usually takes about 15-30 minutes.  You will be given one of the following:  A medicine that numbs the area in and around the cervix (local anesthetic).   A medicine to make you sleep through the procedure (general anesthetic).  You will lie on your back with your legs in stirrups.   A warm metal or plastic instrument (speculum) will be placed in your vagina to keep it open and to allow the health care provider to see the cervix.  There are two ways in which  your cervix can be softened and dilated. These include:   Taking a medicine.   Having thin rods (laminaria) inserted into your cervix.   A curved tool (curette) will be used to scrape cells from the inside lining of the uterus. In some cases, gentle suction is applied with the curette. The curette will then be removed.  AFTER THE PROCEDURE   You will rest in the recovery area until you are stable and are ready to go home.   You may feel sick to your stomach (nauseous) or throw up (vomit) if you were given a general anesthetic.   You may have a sore throat if a tube was placed in your throat during general anesthesia.   You may have light cramping and bleeding. This may last for 2  days to 2 weeks after the procedure.   Your uterus needs to make a new lining after the procedure. This may make your next period late. Document Released: 05/12/2005 Document Revised: 01/12/2013 Document Reviewed: 12/09/2012 [facility] Patient Information 2015 [place], [place]. This information is not intended to replace advice given to you by your health care provider. Make sure you discuss any questions you have with your health care provider.

## 2014-06-28 NOTE — MAU Provider Note (Signed)
History     CSN: [id number]  Arrival date and time: 06/28/14 1022   None     Chief Complaint  Patient presents with  . Miscarriage  . Abdominal Pain  . Extremity Weakness   HPI  Patient is 24 y.o. G1P0000 1176w5d here with complaints of abdominal pain and bleeding.  Pain is 15/10, missed follow up clinic appointment yesterday.  sono 1/21 showed 1671w0d embryo w/no FHT, received cytotec 06/19/2014.    Past Medical History  Diagnosis Date  . Insomnia   . Depression     age 24yo, resolved  . Wears glasses   . Routine gynecological examination     Dr. Jean RosenthalJackson  . Eczema   . Chlamydia contact, treated 02-2014    Past Surgical History  Procedure Laterality Date  . Wisdom tooth extraction      History reviewed. No pertinent family history.  History  Substance Use Topics  . Smoking status: Former Smoker    Types: Cigars  . Smokeless tobacco: Not on file  . Alcohol Use: No     Comment: social    Allergies:  Allergies  Allergen Reactions  . Nickel     Prescriptions prior to admission  Medication Sig Dispense Refill Last Dose  . HYDROcodone-acetaminophen (NORCO/VICODIN) 5-325 MG per tablet Take 1-2 tablets by mouth every 4 (four) hours as needed for moderate pain or severe pain. 10 tablet 0 Past Week at Unknown time  . ibuprofen (ADVIL,MOTRIN) 600 MG tablet Take 1 tablet (600 mg total) by mouth every 6 (six) hours as needed. 30 tablet 0 06/27/2014 at Unknown time  . promethazine (PHENERGAN) 25 MG tablet Take 1 tablet (25 mg total) by mouth every 6 (six) hours as needed for nausea or vomiting. 10 tablet 0 Past Week at Unknown time  . misoprostol (CYTOTEC) 200 MCG tablet Place 4 tablets (800 mcg total) vaginally once. (Patient not taking: Reported on 06/28/2014) 4 tablet 0     Review of Systems  Constitutional: Negative for fever, chills and diaphoresis.  Respiratory: Negative for cough and shortness of breath.   Cardiovascular: Negative for chest pain and leg swelling.   Gastrointestinal: Positive for abdominal pain. Negative for heartburn, nausea, vomiting and diarrhea.  Genitourinary: Negative for dysuria, urgency, frequency and hematuria.  Neurological: Negative for weakness.       No headache   Physical Exam   Blood pressure 116/69, pulse 100, temperature 99.1 F (37.3 C), temperature source Oral, resp. rate 18, height 5' 0.5" (1.537 m), weight 131 lb (59.421 kg), last menstrual period 03/31/2014, SpO2 100 %, unknown if currently breastfeeding.  Physical Exam  Constitutional: She is oriented to person, place, and time. She appears well-developed and well-nourished.  HENT:  Head: Normocephalic and atraumatic.  Eyes: Conjunctivae and EOM are normal.  Neck: Normal range of motion.  Cardiovascular: Normal rate.   Respiratory: Effort normal. No respiratory distress.  GI: Soft. She exhibits no distension. There is no tenderness.  Genitourinary: Vagina normal.  No POC in vault or cervical os, very small amount of dark blood oozing from os.  Musculoskeletal: Normal range of motion. She exhibits no edema.  Neurological: She is alert and oriented to person, place, and time.  Skin: Skin is warm and dry. No erythema.    MAU Course  Procedures  MDM   Labs: Results for orders placed or performed during the hospital encounter of 06/28/14 (from the past 24 hour(s))  CBC   Collection Time: 06/28/14 11:19 AM  Result Value Ref  Range   WBC 9.0 4.0 - 10.5 K/uL   RBC 3.25 (L) 3.87 - 5.11 MIL/uL   Hemoglobin 10.2 (L) 12.0 - 15.0 g/dL   HCT 40.9 (L) 81.1 - 91.4 %   MCV 88.3 78.0 - 100.0 fL   MCH 31.4 26.0 - 34.0 pg   MCHC 35.5 30.0 - 36.0 g/dL   RDW 78.2 95.6 - 21.3 %   Platelets 203 150 - 400 K/uL  hCG, quantitative, pregnancy   Collection Time: 06/28/14 11:50 AM  Result Value Ref Range   hCG, Beta Chain, Quant, S 1024 (H) <5 mIU/mL    US Ob Transvaginal  06/28/2014   CLINICAL DATA:  History of missed abortion. Bleeding, cramping. Status post sided  tech injection 9 days ago.  EXAM: TRANSVAGINAL OB ULTRASOUND  TECHNIQUE: Transvaginal ultrasound was performed for complete evaluation of the gestation as well as the maternal uterus, adnexal regions, and pelvic cul-de-sac.  COMPARISON:  06/15/2014  FINDINGS: Intrauterine gestational sac: None visualized  Yolk sac:  None visualized  Embryo:  None visualized  Maternal uterus/adnexae: Thickening of the endometrium measuring up to 22 mm. Heterogeneous appearance with internal blood flow concerning for retained products of conception. There are blood products also noted within the endometrial canal.  IMPRESSION: Previously seen intrauterine gestational longer visualized. Complex appearance and thickening of the endometrium with areas of internal blood flow concerning for retained products of conception.   Electronically Signed   By: Charlett Nose M.D.   On: 06/28/2014 12:26   US Ob Transvaginal  06/15/2014   CLINICAL DATA:  First trimester pregnancy with uncertain fetal viability. No audible fetal heart tones on Doppler.  EXAM: OBSTETRIC <14 WK [place] AND TRANSVAGINAL OB US  TECHNIQUE: Both transabdominal and transvaginal ultrasound examinations were performed for complete evaluation of the gestation as well as the maternal uterus, adnexal regions, and pelvic cul-de-sac. Transvaginal technique was performed to assess early pregnancy.  COMPARISON:  None.  FINDINGS: Intrauterine gestational sac: Visualized/normal in shape.  Yolk sac:  Visualized  Embryo:  Visualized  Cardiac Activity: Absent  CRL:   23  mm   9 w 0 d                  [place] EDC: 01/18/2015  Maternal uterus/adnexae: Both ovaries are normal in appearance. No mass or free fluid identified.  IMPRESSION: Findings meet definitive criteria for failed pregnancy. This follows SRU consensus guidelines: Diagnostic Criteria for Nonviable Pregnancy Early in the First Trimester. [facility] [phone].   Electronically Signed   By: Myles Rosenthal M.D.   On: 06/15/2014  13:48        Assessment and Plan  Patient is 24 y.o. G1P0000 [redacted]w[redacted]d reporting abdominal pain s/p cytotec for missed abortion - discussed with Dr. Clearance Coots => scheduled for [facility] tomorrow,  doxy now - rx percocet 5/325mg  #30  Pain improved greatly with on 2 percocet 5/325mg  given here in MAU, pt reported pain resolved completely.  Is agreeable to D&C   Kalana Yust ROCIO 06/28/2014, 12:35 PM

## 2014-06-28 NOTE — MAU Note (Signed)
Pt reports that she was suppose to follow up yesterday with Dr Clearance CootsHarper but was not able to make the appointment.

## 2014-06-28 NOTE — MAU Note (Signed)
dX with MAB last wk, was given cytotec wk ago Mon, started process on Tues.  Continues to bleed and cramp.  Feels weak.. Having nausea .

## 2014-06-29 ENCOUNTER — Ambulatory Visit (HOSPITAL_COMMUNITY)
Admission: RE | Admit: 2014-06-29 | Discharge: 2014-06-29 | Disposition: A | Discharge status: Home / Self Care | Payer: BC Managed Care – PPO | Source: Ambulatory Visit | Attending: Obstetrics | Admitting: Obstetrics

## 2014-06-29 ENCOUNTER — Encounter (HOSPITAL_COMMUNITY): Payer: Self-pay | Admitting: Anesthesiology

## 2014-06-29 ENCOUNTER — Ambulatory Visit (HOSPITAL_COMMUNITY): Payer: BC Managed Care – PPO | Admitting: Anesthesiology

## 2014-06-29 ENCOUNTER — Encounter: Payer: Self-pay | Admitting: Obstetrics

## 2014-06-29 ENCOUNTER — Encounter (HOSPITAL_COMMUNITY)
Admission: RE | Disposition: A | Discharge status: Home / Self Care | Payer: Self-pay | Source: Ambulatory Visit | Attending: Obstetrics

## 2014-06-29 DIAGNOSIS — Z79899 Other long term (current) drug therapy: Secondary | ICD-10-CM | POA: Insufficient documentation

## 2014-06-29 DIAGNOSIS — Z87891 Personal history of nicotine dependence: Secondary | ICD-10-CM | POA: Diagnosis not present

## 2014-06-29 DIAGNOSIS — F329 Major depressive disorder, single episode, unspecified: Secondary | ICD-10-CM | POA: Insufficient documentation

## 2014-06-29 DIAGNOSIS — G4700 Insomnia, unspecified: Secondary | ICD-10-CM | POA: Insufficient documentation

## 2014-06-29 DIAGNOSIS — Z791 Long term (current) use of non-steroidal anti-inflammatories (NSAID): Secondary | ICD-10-CM | POA: Diagnosis not present

## 2014-06-29 HISTORY — PX: DILATION AND EVACUATION: SHX1459

## 2014-06-29 SURGERY — DILATION AND EVACUATION, UTERUS
Anesthesia: Monitor Anesthesia Care | Site: Vagina

## 2014-06-29 MED ORDER — PROPOFOL 10 MG/ML IV BOLUS
INTRAVENOUS | Status: AC
Start: 2014-06-29 — End: 2014-06-29
  Filled 2014-06-29: qty 20

## 2014-06-29 MED ORDER — FENTANYL CITRATE 0.05 MG/ML IJ SOLN
INTRAMUSCULAR | Status: DC | PRN
Start: 2014-06-29 — End: 2014-06-29
  Administered 2014-06-29 (×2): 50 ug via INTRAVENOUS

## 2014-06-29 MED ORDER — FENTANYL CITRATE 0.05 MG/ML IJ SOLN
25.0000 ug | INTRAMUSCULAR | Status: DC | PRN
  Administered 2014-06-29: 25 ug via INTRAVENOUS

## 2014-06-29 MED ORDER — LACTATED RINGERS IV SOLN
INTRAVENOUS | Status: DC
  Administered 2014-06-29: 09:00:00 via INTRAVENOUS

## 2014-06-29 MED ORDER — FENTANYL CITRATE 0.05 MG/ML IJ SOLN
INTRAMUSCULAR | Status: AC
Start: 2014-06-29 — End: 2014-06-29
  Filled 2014-06-29: qty 2

## 2014-06-29 MED ORDER — DEXAMETHASONE SODIUM PHOSPHATE 10 MG/ML IJ SOLN
INTRAMUSCULAR | Status: DC | PRN
  Administered 2014-06-29: 4 mg via INTRAVENOUS

## 2014-06-29 MED ORDER — MIDAZOLAM HCL 2 MG/2ML IJ SOLN
INTRAMUSCULAR | Status: DC | PRN
  Administered 2014-06-29: 2 mg via INTRAVENOUS

## 2014-06-29 MED ORDER — LIDOCAINE HCL 1 % IJ SOLN
INTRAMUSCULAR | Status: DC | PRN
  Administered 2014-06-29: 20 mL

## 2014-06-29 MED ORDER — ONDANSETRON HCL 4 MG/2ML IJ SOLN
INTRAMUSCULAR | Status: DC | PRN
  Administered 2014-06-29: 4 mg via INTRAVENOUS

## 2014-06-29 MED ORDER — LIDOCAINE HCL (CARDIAC) 20 MG/ML IV SOLN
INTRAVENOUS | Status: AC
  Filled 2014-06-29: qty 5

## 2014-06-29 MED ORDER — METRONIDAZOLE 500 MG PO TABS: 500.0000 mg | ORAL_TABLET | Freq: Three times a day (TID) | ORAL | Status: DC

## 2014-06-29 MED ORDER — MIDAZOLAM HCL 2 MG/2ML IJ SOLN
INTRAMUSCULAR | Status: AC
Start: 2014-06-29 — End: 2014-06-29
  Filled 2014-06-29: qty 2

## 2014-06-29 MED ORDER — PROMETHAZINE HCL 25 MG/ML IJ SOLN: 6.2500 mg | INTRAMUSCULAR | Status: DC | PRN

## 2014-06-29 MED ORDER — LACTATED RINGERS IV SOLN: INTRAVENOUS | Status: DC

## 2014-06-29 MED ORDER — SCOPOLAMINE 1 MG/3DAYS TD PT72
1.0000 | MEDICATED_PATCH | Freq: Once | TRANSDERMAL | Status: DC
  Administered 2014-06-29: 1.5 mg via TRANSDERMAL

## 2014-06-29 MED ORDER — LIDOCAINE HCL (CARDIAC) 20 MG/ML IV SOLN
INTRAVENOUS | Status: DC | PRN
  Administered 2014-06-29: 50 mg via INTRAVENOUS

## 2014-06-29 MED ORDER — SCOPOLAMINE 1 MG/3DAYS TD PT72
MEDICATED_PATCH | TRANSDERMAL | Status: AC
  Filled 2014-06-29: qty 1

## 2014-06-29 MED ORDER — DEXAMETHASONE SODIUM PHOSPHATE 10 MG/ML IJ SOLN
INTRAMUSCULAR | Status: AC
  Filled 2014-06-29: qty 1

## 2014-06-29 MED ORDER — DOXYCYCLINE HYCLATE 100 MG PO CAPS: 100.0000 mg | ORAL_CAPSULE | Freq: Two times a day (BID) | ORAL | Status: DC

## 2014-06-29 MED ORDER — ATROPINE SULFATE 0.4 MG/ML IJ SOLN
INTRAMUSCULAR | Status: AC
  Filled 2014-06-29: qty 1

## 2014-06-29 MED ORDER — LIDOCAINE HCL 1 % IJ SOLN
INTRAMUSCULAR | Status: AC
  Filled 2014-06-29: qty 20

## 2014-06-29 MED ORDER — PROPOFOL 10 MG/ML IV EMUL
INTRAVENOUS | Status: DC | PRN
  Administered 2014-06-29: 20 mg via INTRAVENOUS
  Administered 2014-06-29: 50 mg via INTRAVENOUS
  Administered 2014-06-29: 20 mg via INTRAVENOUS
  Administered 2014-06-29: 50 mg via INTRAVENOUS

## 2014-06-29 MED ORDER — KETOROLAC TROMETHAMINE 30 MG/ML IJ SOLN
INTRAMUSCULAR | Status: DC | PRN
  Administered 2014-06-29: 30 mg via INTRAVENOUS

## 2014-06-29 MED ORDER — ONDANSETRON HCL 4 MG/2ML IJ SOLN
INTRAMUSCULAR | Status: AC
  Filled 2014-06-29: qty 2

## 2014-06-29 MED ORDER — FENTANYL CITRATE 0.05 MG/ML IJ SOLN
INTRAMUSCULAR | Status: AC
  Filled 2014-06-29: qty 2

## 2014-06-29 MED ORDER — EPHEDRINE 5 MG/ML INJ
INTRAVENOUS | Status: AC
  Filled 2014-06-29: qty 10

## 2014-06-29 MED ORDER — 0.9 % SODIUM CHLORIDE (POUR BTL) OPTIME
TOPICAL | Status: DC | PRN
  Administered 2014-06-29: 1000 mL

## 2014-06-29 MED ORDER — KETOROLAC TROMETHAMINE 30 MG/ML IJ SOLN
INTRAMUSCULAR | Status: AC
  Filled 2014-06-29: qty 1

## 2014-06-29 SURGICAL SUPPLY — 20 items
CATH ROBINSON RED A/P 16FR (CATHETERS) ×2 IMPLANT
CLOTH BEACON ORANGE TIMEOUT ST (SAFETY) ×2 IMPLANT
DECANTER SPIKE VIAL GLASS SM (MISCELLANEOUS) ×2 IMPLANT
GLOVE BIO SURGEON STRL SZ8 (GLOVE) ×4 IMPLANT
GOWN STRL REUS W/TWL LRG LVL3 (GOWN DISPOSABLE) ×4 IMPLANT
KIT BERKELEY 1ST TRIMESTER 3/8 (MISCELLANEOUS) ×2 IMPLANT
NEEDLE HYPO 25X1 1.5 SAFETY (NEEDLE) ×2 IMPLANT
NS IRRIG 1000ML POUR BTL (IV SOLUTION) ×2 IMPLANT
PACK VAGINAL MINOR WOMEN LF (CUSTOM PROCEDURE TRAY) ×2 IMPLANT
PAD OB MATERNITY 4.3X12.25 (PERSONAL CARE ITEMS) ×2 IMPLANT
PAD PREP 24X48 CUFFED NSTRL (MISCELLANEOUS) ×2 IMPLANT
SET BERKELEY SUCTION TUBING (SUCTIONS) ×2 IMPLANT
SYR 3ML 23GX1 SAFETY (SYRINGE) ×2 IMPLANT
TOWEL OR 17X24 6PK STRL BLUE (TOWEL DISPOSABLE) ×4 IMPLANT
VACURETTE 10 RIGID CVD (CANNULA) IMPLANT
VACURETTE 12 RIGID CVD (CANNULA) ×2 IMPLANT
VACURETTE 6 ASPIR F TIP BERK (CANNULA) ×2 IMPLANT
VACURETTE 7MM CVD STRL WRAP (CANNULA) IMPLANT
VACURETTE 8 RIGID CVD (CANNULA) IMPLANT
VACURETTE 9 RIGID CVD (CANNULA) IMPLANT

## 2014-06-29 NOTE — Anesthesia Postprocedure Evaluation (Signed)
  Anesthesia Post-op Note  Patient: Christine Ross  Procedure(s) Performed: Procedure(s): DILATATION AND EVACUATION (N/A)  Patient Location: PACU  Anesthesia Type:MAC  Level of Consciousness: awake, alert  and oriented  Airway and Oxygen Therapy: Patient Spontanous Breathing  Post-op Pain: none  Post-op Assessment: Post-op Vital signs reviewed, Patient's Cardiovascular Status Stable, Respiratory Function Stable, Patent Airway, No signs of Nausea or vomiting and Pain level controlled  Post-op Vital Signs: Reviewed and stable  Last Vitals:  Filed Vitals:   06/29/14 1053  BP:   Pulse:   Temp: 36.7 C  Resp:     Complications: No apparent anesthesia complications

## 2014-06-29 NOTE — Anesthesia Preprocedure Evaluation (Addendum)
Anesthesia Evaluation  Patient identified by MRN, date of birth, ID band Patient awake    Reviewed: Allergy & Precautions, NPO status , Patient's Chart, lab work & pertinent test results  Airway Mallampati: III  TM Distance: >3 FB Neck ROM: Full    Dental no notable dental hx. (+) Teeth Intact   Pulmonary Current Smoker, former smoker,  breath sounds clear to auscultation  Pulmonary exam normal       Cardiovascular negative cardio ROS  Rhythm:Regular Rate:Normal     Neuro/Psych PSYCHIATRIC DISORDERS Depression negative neurological ROS     GI/Hepatic negative GI ROS, Neg liver ROS,   Endo/Other  negative endocrine ROS  Renal/GU negative Renal ROS  negative genitourinary   Musculoskeletal negative musculoskeletal ROS (+)   Abdominal   Peds  Hematology  (+) anemia ,   Anesthesia Other Findings   Reproductive/Obstetrics Retained POC- missed Ab                           Anesthesia Physical Anesthesia Plan  ASA: II  Anesthesia Plan: MAC and General   Post-op Pain Management:    Induction: Intravenous  Airway Management Planned: Natural Airway and LMA  Additional Equipment:   Intra-op Plan:   Post-operative Plan: Extubation in OR  Informed Consent: I have reviewed the patients History and Physical, chart, labs and discussed the procedure including the risks, benefits and alternatives for the proposed anesthesia with the patient or authorized representative who has indicated his/her understanding and acceptance.   Dental advisory given  Plan Discussed with: CRNA, Anesthesiologist and Surgeon  Anesthesia Plan Comments:         Anesthesia Quick Evaluation

## 2014-06-29 NOTE — H&P (Signed)
Christine Ross is an 24 y.o. female. Patient had Cytotec for 9 week IUFD.  Passed products but continued to have cramping and bleeding.  F/U ultrasound revealed retained POC.    Patient's last menstrual period was 03/31/2014.    Past Medical History  Diagnosis Date  . Insomnia   . Depression     age 56yo, resolved  . Wears glasses   . Routine gynecological examination     Dr. Jean Rosenthal  . Eczema   . Chlamydia contact, treated 02-2014    Past Surgical History  Procedure Laterality Date  . Wisdom tooth extraction      History reviewed. No pertinent family history.  Social History:  reports that she has quit smoking. Her smoking use included Cigars. She does not have any smokeless tobacco history on file. She reports that she does not drink alcohol or use illicit drugs.  Allergies:  Allergies  Allergen Reactions  . Nickel     Prescriptions prior to admission  Medication Sig Dispense Refill Last Dose  . HYDROcodone-acetaminophen (NORCO/VICODIN) 5-325 MG per tablet Take 1-2 tablets by mouth every 4 (four) hours as needed for moderate pain or severe pain. 6 tablet 0   . ibuprofen (ADVIL,MOTRIN) 600 MG tablet Take 1 tablet (600 mg total) by mouth every 6 (six) hours as needed. 30 tablet 0 06/27/2014 at Unknown time  . misoprostol (CYTOTEC) 200 MCG tablet Place 4 tablets (800 mcg total) vaginally once. (Patient not taking: Reported on 06/28/2014) 4 tablet 0   . oxyCODONE-acetaminophen (PERCOCET/ROXICET) 5-325 MG per tablet Take 2 tablets by mouth every 3 (three) hours as needed for severe pain. 30 tablet 0   . promethazine (PHENERGAN) 25 MG tablet Take 1 tablet (25 mg total) by mouth every 6 (six) hours as needed for nausea or vomiting. 10 tablet 0 Past Week at Unknown time    Review of Systems  Gastrointestinal: Positive for abdominal pain.  All other systems reviewed and are negative.   Last menstrual period 03/31/2014, unknown if currently breastfeeding. Physical Exam   Nursing note and vitals reviewed. Constitutional: She is oriented to person, place, and time. She appears well-developed and well-nourished.  HENT:  Head: Normocephalic and atraumatic.  Eyes: Conjunctivae are normal. Pupils are equal, round, and reactive to light.  Neck: Normal range of motion. Neck supple.  Cardiovascular: Normal rate and regular rhythm.   Respiratory: Effort normal and breath sounds normal.  GI: Soft.  Genitourinary: Vagina normal and uterus normal.  Musculoskeletal: Normal range of motion.  Neurological: She is alert and oriented to person, place, and time.  Skin: Skin is warm and dry.  Psychiatric: She has a normal mood and affect. Her behavior is normal. Judgment and thought content normal.    Results for orders placed or performed during the hospital encounter of 06/28/14 (from the past 24 hour(s))  CBC     Status: Abnormal   Collection Time: 06/28/14 11:19 AM  Result Value Ref Range   WBC 9.0 4.0 - 10.5 K/uL   RBC 3.25 (L) 3.87 - 5.11 MIL/uL   Hemoglobin 10.2 (L) 12.0 - 15.0 g/dL   HCT 16.1 (L) 09.6 - 04.5 %   MCV 88.3 78.0 - 100.0 fL   MCH 31.4 26.0 - 34.0 pg   MCHC 35.5 30.0 - 36.0 g/dL   RDW 40.9 81.1 - 91.4 %   Platelets 203 150 - 400 K/uL  hCG, quantitative, pregnancy     Status: Abnormal   Collection Time: 06/28/14 11:50 AM  Result Value Ref Range   hCG, Beta Chain, Quant, S 1024 (H) <5 mIU/mL    [place] Ob Transvaginal  06/28/2014   CLINICAL DATA:  History of missed abortion. Bleeding, cramping. Status post sided tech injection 9 days ago.  EXAM: TRANSVAGINAL OB ULTRASOUND  TECHNIQUE: Transvaginal ultrasound was performed for complete evaluation of the gestation as well as the maternal uterus, adnexal regions, and pelvic cul-de-sac.  COMPARISON:  06/15/2014  FINDINGS: Intrauterine gestational sac: None visualized  Yolk sac:  None visualized  Embryo:  None visualized  Maternal uterus/adnexae: Thickening of the endometrium measuring up to 22 mm.  Heterogeneous appearance with internal blood flow concerning for retained products of conception. There are blood products also noted within the endometrial canal.  IMPRESSION: Previously seen intrauterine gestational longer visualized. Complex appearance and thickening of the endometrium with areas of internal blood flow concerning for retained products of conception.   Electronically Signed   By: Charlett NoseKevin  Dover M.D.   On: 06/28/2014 12:26    Assessment/Plan: Retained POC after Cytotec induced abortion for 9 week IUFD.  Plan Suction D&E.  HARPER,CHARLES A 06/29/2014, 8:43 AM

## 2014-06-29 NOTE — Op Note (Signed)
Preoperative diagnosis:Retained Products of Conception  Postoperative diagnosis: Same  Procedure: Suction dilatation and curretage Surgeon: Maylani Embree A  Anesthesia:Mac with paracervical block  Estimated blood loss: 50 ml  Urine output: 10 ml  IV Fluids: 500 ml  Complications: None  Specimen: PATHOLOGY  Operative Findings: Scant amount of POC  Description of procedure:   The patient was taken to the operating room and placed on the operating table in the semi-lithotomy position in [place] stirrups.  Examination under anesthesia was performed.  The patient was prepped and draped in the usual manner.  After a time-out had been completed, a speculum was placed in the vagina.  The anterior lip of the cervix was grasped with a single-toothed tenaculum.  A paracervical block was performed using 10 ml of 1% lidocaine.  The block was performed at 4 and 8 o'clock at the cervical vaginal junction. The cervix was dilated with Shawnie PonsPratt dilators.  A 6 -mm suction curet was inserted in the uterine cavity.  The device was activated and the curet rotated to evacuate the products of conception.   All the instruments were removed from the vagina.  Final instrument counts were correct.  The patient was taken to the PACU in stable condition.

## 2014-06-29 NOTE — Transfer of Care (Signed)
Immediate Anesthesia Transfer of Care Note  Patient: Christine Ross  Procedure(s) Performed: Procedure(s): DILATATION AND EVACUATION (N/A)  Patient Location: PACU  Anesthesia Type:MAC  Level of Consciousness: awake, alert  and oriented  Airway & Oxygen Therapy: Patient Spontanous Breathing  Post-op Assessment: Report given to RN and Post -op Vital signs reviewed and stable  Post vital signs: Reviewed and stable  Last Vitals:  Filed Vitals:   06/29/14 0842  BP: 102/64  Pulse: 110  Temp: 38.7 C  Resp: 16    Complications: No apparent anesthesia complications

## 2014-06-29 NOTE — Discharge Instructions (Signed)
Post Anesthesia Home Care Instructions  Activity: Get plenty of rest for the remainder of the day. A responsible adult should stay with you for 24 hours following the procedure.  For the next 24 hours, DO NOT: -Drive a car -Advertising copywriter -Drink alcoholic beverages -Take any medication unless instructed by your physician -Make any legal decisions or sign important papers.  Meals: Start with liquid foods such as gelatin or soup. Progress to regular foods as tolerated. Avoid greasy, spicy, heavy foods. If nausea and/or vomiting occur, drink only clear liquids until the nausea and/or vomiting subsides. Call your physician if vomiting continues.  Special Instructions/Symptoms: Your throat may feel dry or sore from the anesthesia or the breathing tube placed in your throat during surgery. If this causes discomfort, gargle with warm salt water. The discomfort should disappear within 24 hours. DISCHARGE INSTRUCTIONS: D&C / D&E The following instructions have been prepared to help you care for yourself upon your return home.   Personal hygiene:  Use sanitary pads for vaginal drainage, not tampons.  Shower the day after your procedure.  NO tub baths, pools or Jacuzzis for 2-3 weeks.  Wipe front to back after using the bathroom.  Activity and limitations:  Do NOT drive or operate any equipment for 24 hours. The effects of anesthesia are still present and drowsiness may result.  Do NOT rest in bed all day.  Walking is encouraged.  Walk up and down stairs slowly.  You may resume your normal activity in one to two days or as indicated by your physician.  Sexual activity: NO intercourse for at least 2 weeks after the procedure, or as indicated by your physician.  Diet: Eat a light meal as desired this evening. You may resume your usual diet tomorrow.  Return to work: You may resume your work activities in one to two days or as indicated by your doctor.  What to expect after your  surgery: Expect to have vaginal bleeding/discharge for 2-3 days and spotting for up to 10 days. It is not unusual to have soreness for up to 1-2 weeks. You may have a slight burning sensation when you urinate for the first day. Mild cramps may continue for a couple of days. You may have a regular period in 2-6 weeks.  Call your doctor for any of the following:  Excessive vaginal bleeding, saturating and changing one pad every hour.  Inability to urinate 6 hours after discharge from hospital.  Pain not relieved by pain medication.  Fever of 100.4 F or greater.  Unusual vaginal discharge or odor.   Call for an appointment:    Patients signature: ______________________  Nurses signature ________________________  Support person's signature_______________________   DISCHARGE INSTRUCTIONS: D&C / D&E The following instructions have been prepared to help you care for yourself upon your return home.   Personal hygiene:  Use sanitary pads for vaginal drainage, not tampons.  Shower the day after your procedure.  NO tub baths, pools or Jacuzzis for 2-3 weeks.  Wipe front to back after using the bathroom.  Activity and limitations:  Do NOT drive or operate any equipment for 24 hours. The effects of anesthesia are still present and drowsiness may result.  Do NOT rest in bed all day.  Walking is encouraged.  Walk up and down stairs slowly.  You may resume your normal activity in one to two days or as indicated by your physician.  Sexual activity: NO intercourse for at least 2 weeks after the procedure, or  as indicated by your physician.  Diet: Eat a light meal as desired this evening. You may resume your usual diet tomorrow.  Return to work: You may resume your work activities in one to two days or as indicated by your doctor.  What to expect after your surgery: Expect to have vaginal bleeding/discharge for 2-3 days and spotting for up to 10 days. It is not unusual to have  soreness for up to 1-2 weeks. You may have a slight burning sensation when you urinate for the first day. Mild cramps may continue for a couple of days. You may have a regular period in 2-6 weeks.  Call your doctor for any of the following:  Excessive vaginal bleeding, saturating and changing one pad every hour.  Inability to urinate 6 hours after discharge from hospital.  Pain not relieved by pain medication.  Fever of 100.4 F or greater.  Unusual vaginal discharge or odor.   Call for an appointment:    Patients signature: ______________________  Nurses signature ________________________  Support person's signature_______________________

## 2014-06-30 ENCOUNTER — Encounter (HOSPITAL_COMMUNITY): Payer: Self-pay | Admitting: Obstetrics

## 2014-07-17 ENCOUNTER — Encounter: Payer: Self-pay | Admitting: Obstetrics

## 2014-07-17 ENCOUNTER — Ambulatory Visit (INDEPENDENT_AMBULATORY_CARE_PROVIDER_SITE_OTHER): Payer: BLUE CROSS/BLUE SHIELD | Admitting: Obstetrics

## 2014-07-17 ENCOUNTER — Encounter: Payer: BLUE CROSS/BLUE SHIELD | Admitting: Obstetrics

## 2014-07-17 VITALS — BP 118/78 | HR 90 | Temp 98.2°F | Wt 129.0 lb

## 2014-07-17 DIAGNOSIS — O039 Complete or unspecified spontaneous abortion without complication: Secondary | ICD-10-CM

## 2014-07-17 DIAGNOSIS — Z3009 Encounter for other general counseling and advice on contraception: Secondary | ICD-10-CM

## 2014-07-18 ENCOUNTER — Encounter: Payer: Self-pay | Admitting: Obstetrics

## 2014-07-18 NOTE — Progress Notes (Signed)
Patient ID: Christine Ross, female   DOB: 01/09/1991, 24 y.o.   MRN: [id number]  Chief Complaint  Patient presents with  . Routine Post Op    post D&E    HPI Christine Ross is a 24 y.o. female.  Presents for 2 week check up after SAB, incomplete, and D&E for retained POC.  No complaints.  Denies vaginal bleeding or cramping. HPI  Past Medical History  Diagnosis Date  . Insomnia   . Depression     age 24yo, resolved  . Wears glasses   . Routine gynecological examination     Dr. Jean RosenthalJackson  . Eczema   . Chlamydia contact, treated 02-2014    Past Surgical History  Procedure Laterality Date  . Wisdom tooth extraction    . Dilation and evacuation N/A 06/29/2014    Procedure: DILATATION AND EVACUATION;  Surgeon: Brock Badharles A Cassidee Deats, MD;  Location: [facility];  Service: Gynecology;  Laterality: N/A;    History reviewed. No pertinent family history.  Social History History  Substance Use Topics  . Smoking status: Current Every Day Smoker    Types: Cigars  . Smokeless tobacco: Not on file  . Alcohol Use: No     Comment: social    Allergies  Allergen Reactions  . Nickel     Current Outpatient Prescriptions  Medication Sig Dispense Refill  . doxycycline (VIBRAMYCIN) 100 MG capsule Take 1 capsule (100 mg total) by mouth 2 (two) times daily. (Patient not taking: Reported on 07/17/2014) 14 capsule 0  . ibuprofen (ADVIL,MOTRIN) 600 MG tablet Take 1 tablet (600 mg total) by mouth every 6 (six) hours as needed. (Patient not taking: Reported on 07/17/2014) 30 tablet 0  . metroNIDAZOLE (FLAGYL) 500 MG tablet Take 1 tablet (500 mg total) by mouth 3 (three) times daily. (Patient not taking: Reported on 07/17/2014) 14 tablet 0  . oxyCODONE-acetaminophen (PERCOCET/ROXICET) 5-325 MG per tablet Take 2 tablets by mouth every 3 (three) hours as needed for severe pain. (Patient not taking: Reported on 07/17/2014) 30 tablet 0  . promethazine (PHENERGAN) 25 MG tablet Take 1 tablet (25 mg total) by mouth  every 6 (six) hours as needed for nausea or vomiting. (Patient not taking: Reported on 07/17/2014) 10 tablet 0   No current facility-administered medications for this visit.    Review of Systems Review of Systems Constitutional: negative for fatigue and weight loss Respiratory: negative for cough and wheezing Cardiovascular: negative for chest pain, fatigue and palpitations Gastrointestinal: negative for abdominal pain and change in bowel habits Genitourinary:negative Integument/breast: negative for nipple discharge Musculoskeletal:negative for myalgias Neurological: negative for gait problems and tremors Behavioral/Psych: negative for abusive relationship, depression Endocrine: negative for temperature intolerance     Blood pressure 118/78, pulse 90, temperature 98.2 F (36.8 C), weight 129 lb (58.514 kg), last menstrual period 03/31/2014, unknown if currently breastfeeding.  Physical Exam Physical Exam General:   alert  Skin:   no rash or abnormalities  Lungs:   clear to auscultation bilaterally  Heart:   regular rate and rhythm, S1, S2 normal, no murmur, click, rub or gallop  Breasts:   normal without suspicious masses, skin or nipple changes or axillary nodes  Abdomen:  normal findings: no organomegaly, soft, non-tender and no hernia  Pelvis:  External genitalia: normal general appearance Urinary system: urethral meatus normal and bladder without fullness, nontender Vaginal: normal without tenderness, induration or masses Cervix: normal appearance Adnexa: normal bimanual exam Uterus: anteverted and non-tender, normal size      Data  Reviewed Pathology  Assessment     S/P D&E for retained POC after incomplete SAB. Doing well. Contraceptive Management.  Options discussed.    Plan    F/U 4 weeks  No orders of the defined types were placed in this encounter.   No orders of the defined types were placed in this encounter.

## 2014-07-28 ENCOUNTER — Inpatient Hospital Stay (HOSPITAL_COMMUNITY)
Admission: AD | Admit: 2014-07-28 | Discharge: 2014-07-29 | Disposition: A | Discharge status: Home / Self Care | Payer: BC Managed Care – PPO | Source: Ambulatory Visit | Attending: Obstetrics | Admitting: Obstetrics

## 2014-07-28 ENCOUNTER — Encounter (HOSPITAL_COMMUNITY): Payer: Self-pay | Admitting: *Deleted

## 2014-07-28 DIAGNOSIS — Z3A01 Less than 8 weeks gestation of pregnancy: Secondary | ICD-10-CM | POA: Diagnosis not present

## 2014-07-28 DIAGNOSIS — Z3A00 Weeks of gestation of pregnancy not specified: Secondary | ICD-10-CM | POA: Diagnosis not present

## 2014-07-28 DIAGNOSIS — O99330 Smoking (tobacco) complicating pregnancy, unspecified trimester: Secondary | ICD-10-CM | POA: Diagnosis not present

## 2014-07-28 DIAGNOSIS — O209 Hemorrhage in early pregnancy, unspecified: Secondary | ICD-10-CM | POA: Diagnosis not present

## 2014-07-28 DIAGNOSIS — F17210 Nicotine dependence, cigarettes, uncomplicated: Secondary | ICD-10-CM | POA: Insufficient documentation

## 2014-07-28 DIAGNOSIS — N939 Abnormal uterine and vaginal bleeding, unspecified: Secondary | ICD-10-CM | POA: Diagnosis present

## 2014-07-28 LAB — CBC WITH DIFFERENTIAL/PLATELET
BASOS ABS: 0 10*3/uL (ref 0.0–0.1)
BASOS PCT: 0 % (ref 0–1)
Eosinophils Absolute: 0.2 10*3/uL (ref 0.0–0.7)
Eosinophils Relative: 4 % (ref 0–5)
HCT: 32.1 % — ABNORMAL LOW (ref 36.0–46.0)
HEMOGLOBIN: 10.6 g/dL — AB (ref 12.0–15.0)
Lymphocytes Relative: 53 % — ABNORMAL HIGH (ref 12–46)
Lymphs Abs: 2.8 10*3/uL (ref 0.7–4.0)
MCH: 29.2 pg (ref 26.0–34.0)
MCHC: 33 g/dL (ref 30.0–36.0)
MCV: 88.4 fL (ref 78.0–100.0)
MONOS PCT: 7 % (ref 3–12)
Monocytes Absolute: 0.4 10*3/uL (ref 0.1–1.0)
NEUTROS PCT: 36 % — AB (ref 43–77)
Neutro Abs: 1.9 10*3/uL (ref 1.7–7.7)
Platelets: 200 10*3/uL (ref 150–400)
RBC: 3.63 MIL/uL — ABNORMAL LOW (ref 3.87–5.11)
RDW: 12.9 % (ref 11.5–15.5)
WBC: 5.2 10*3/uL (ref 4.0–10.5)

## 2014-07-28 LAB — URINALYSIS, ROUTINE W REFLEX MICROSCOPIC
BILIRUBIN URINE: NEGATIVE
GLUCOSE, UA: NEGATIVE mg/dL
Ketones, ur: 15 mg/dL — AB
Leukocytes, UA: NEGATIVE
Nitrite: NEGATIVE
Protein, ur: NEGATIVE mg/dL
Specific Gravity, Urine: 1.025 (ref 1.005–1.030)
UROBILINOGEN UA: 0.2 mg/dL (ref 0.0–1.0)
pH: 6 (ref 5.0–8.0)

## 2014-07-28 LAB — URINE MICROSCOPIC-ADD ON

## 2014-07-28 LAB — HCG, QUANTITATIVE, PREGNANCY: hCG, Beta Chain, Quant, S: 72 m[IU]/mL — ABNORMAL HIGH (ref <5)

## 2014-07-28 LAB — POCT PREGNANCY, URINE: Preg Test, Ur: POSITIVE — AB

## 2014-07-28 NOTE — MAU Provider Note (Signed)
CSN: [id number]     Arrival date & time 07/28/14  2131 History   None    Chief Complaint  Patient presents with  . Vaginal Bleeding     (Consider location/radiation/quality/duration/timing/severity/associated sxs/prior Treatment) Patient is a 24 y.o. female presenting with vaginal bleeding. The history is provided by the patient.  Vaginal Bleeding This is a new problem. The current episode started today.   Christine Ross is a 24 y.o. female who presents to the MAU with vaginal bleeding. She is a G1P0010. She had a SAB in Feb and had a positive pregnancy test today. She believes this is a new pregnancy. [facility]  Past Medical History  Diagnosis Date  . Insomnia   . Depression     age 24yo, resolved  . Wears glasses   . Routine gynecological examination     Dr. Jean RosenthalJackson  . Eczema   . Chlamydia contact, treated 02-2014   Past Surgical History  Procedure Laterality Date  . Wisdom tooth extraction    . Dilation and evacuation N/A 06/29/2014    Procedure: DILATATION AND EVACUATION;  Surgeon: Brock Badharles A Harper, MD;  Location: [facility];  Service: Gynecology;  Laterality: N/A;   History reviewed. No pertinent family history. History  Substance Use Topics  . Smoking status: Current Every Day Smoker    Types: Cigars  . Smokeless tobacco: Not on file  . Alcohol Use: No     Comment: social   OB History    Gravida Para Term Preterm AB TAB SAB Ectopic Multiple Living   2 0 0 0 1 0 1 0 0 0      Review of Systems  Genitourinary: Positive for vaginal bleeding.  all other systems negative    Allergies  Nickel  Home Medications   Prior to Admission medications   Medication Sig Start Date End Date Taking? Authorizing Provider  doxycycline (VIBRAMYCIN) 100 MG capsule Take 1 capsule (100 mg total) by mouth 2 (two) times daily. Patient not taking: Reported on 07/17/2014 06/29/14   Brock Badharles A Harper, MD  ibuprofen (ADVIL,MOTRIN) 600 MG  tablet Take 1 tablet (600 mg total) by mouth every 6 (six) hours as needed. Patient not taking: Reported on 07/17/2014 06/19/14   Iona HansenJennifer Irene Rasch, NP  metroNIDAZOLE (FLAGYL) 500 MG tablet Take 1 tablet (500 mg total) by mouth 3 (three) times daily. Patient not taking: Reported on 07/17/2014 06/29/14   Brock Badharles A Harper, MD  oxyCODONE-acetaminophen (PERCOCET/ROXICET) 5-325 MG per tablet Take 2 tablets by mouth every 3 (three) hours as needed for severe pain. Patient not taking: Reported on 07/17/2014 06/28/14   Perry MountKristy Rocio Acosta, MD  promethazine (PHENERGAN) 25 MG tablet Take 1 tablet (25 mg total) by mouth every 6 (six) hours as needed for nausea or vomiting. Patient not taking: Reported on 07/17/2014 06/19/14   Iona HansenJennifer Irene Rasch, NP   BP 121/66 mmHg  Pulse 88  Temp(Src) 99.2 F (37.3 C)  Resp 18  Ht 5' (1.524 m)  Wt 129 lb 12.8 oz (58.877 kg)  BMI 25.35 kg/m2  LMP 07/28/2014 (LMP Unknown) Physical Exam  Constitutional: She is oriented to person, place, and time. She appears well-developed and well-nourished. No distress.  HENT:  Head: Normocephalic.  Eyes: EOM are normal.  Neck: Neck supple.  Cardiovascular: Normal rate.   Pulmonary/Chest: Effort normal.  Abdominal: Soft. There is no tenderness.  Genitourinary:  External genitalia without lesions, scant blood vaginal vault. Cervix long, closed, no  CMT, no adnexal tenderness. Uterus without palpable enlargement.   Musculoskeletal: Normal range of motion.  Neurological: She is alert and oriented to person, place, and time. No cranial nerve deficit.  Skin: Skin is warm and dry.  Psychiatric: She has a normal mood and affect. Her behavior is normal.  Nursing note and vitals reviewed.   ED Course  Procedures (including critical care time) Labs Review Results for orders placed or performed during the hospital encounter of 07/28/14 (from the past 24 hour(s))  Urinalysis, Routine w reflex microscopic     Status: Abnormal   Collection  Time: 07/28/14  9:40 PM  Result Value Ref Range   Color, Urine YELLOW YELLOW   APPearance CLEAR CLEAR   Specific Gravity, Urine 1.025 1.005 - 1.030   pH 6.0 5.0 - 8.0   Glucose, UA NEGATIVE NEGATIVE mg/dL   Hgb urine dipstick LARGE (A) NEGATIVE   Bilirubin Urine NEGATIVE NEGATIVE   Ketones, ur 15 (A) NEGATIVE mg/dL   Protein, ur NEGATIVE NEGATIVE mg/dL   Urobilinogen, UA 0.2 0.0 - 1.0 mg/dL   Nitrite NEGATIVE NEGATIVE   Leukocytes, UA NEGATIVE NEGATIVE  Urine microscopic-add on     Status: Abnormal   Collection Time: 07/28/14  9:40 PM  Result Value Ref Range   Squamous Epithelial / LPF FEW (A) RARE   WBC, UA 0-2 <3 WBC/hpf   RBC / HPF 7-10 <3 RBC/hpf   Bacteria, UA FEW (A) RARE  Pregnancy, urine POC     Status: Abnormal   Collection Time: 07/28/14  9:53 PM  Result Value Ref Range   Preg Test, Ur POSITIVE (A) NEGATIVE  CBC with Differential/Platelet     Status: Abnormal   Collection Time: 07/28/14 10:30 PM  Result Value Ref Range   WBC 5.2 4.0 - 10.5 K/uL   RBC 3.63 (L) 3.87 - 5.11 MIL/uL   Hemoglobin 10.6 (L) 12.0 - 15.0 g/dL   HCT 96.0 (L) 45.4 - 09.8 %   MCV 88.4 78.0 - 100.0 fL   MCH 29.2 26.0 - 34.0 pg   MCHC 33.0 30.0 - 36.0 g/dL   RDW 11.9 14.7 - 82.9 %   Platelets 200 150 - 400 K/uL   Neutrophils Relative % 36 (L) 43 - 77 %   Neutro Abs 1.9 1.7 - 7.7 K/uL   Lymphocytes Relative 53 (H) 12 - 46 %   Lymphs Abs 2.8 0.7 - 4.0 K/uL   Monocytes Relative 7 3 - 12 %   Monocytes Absolute 0.4 0.1 - 1.0 K/uL   Eosinophils Relative 4 0 - 5 %   Eosinophils Absolute 0.2 0.0 - 0.7 K/uL   Basophils Relative 0 0 - 1 %   Basophils Absolute 0.0 0.0 - 0.1 K/uL  hCG, quantitative, pregnancy     Status: Abnormal   Collection Time: 07/28/14 10:30 PM  Result Value Ref Range   hCG, Beta Chain, Quant, S 72 (H) <5 mIU/mL  Wet prep, genital     Status: Abnormal   Collection Time: 07/28/14 11:45 PM  Result Value Ref Range   Yeast Wet Prep HPF POC NONE SEEN NONE SEEN   Trich, Wet Prep  NONE SEEN NONE SEEN   Clue Cells Wet Prep HPF POC FEW (A) NONE SEEN   WBC, Wet Prep HPF POC FEW (A) NONE SEEN     MDM  24 y.o. female with vaginal bleeding and positive pregnancy. Unsure if hormone still present from previous SAB or if this is a new pregnancy. Discussed  with the patient will need to repeat the pregnancy hormone level in 48 hours evaluate. Discussed with patient pelvic rest and return for worsening symptoms. She voices understanding and agrees with plan.  Final diagnoses:  Bleeding in early pregnancy

## 2014-07-28 NOTE — MAU Note (Signed)
Had D&C 2/4 for SAB. Just found out last night is pregnant. Started bleeding about 1800 tonight. Having some lower abd pain and headache earlier.

## 2014-07-29 LAB — WET PREP, GENITAL
TRICH WET PREP: NONE SEEN
YEAST WET PREP: NONE SEEN

## 2014-07-29 NOTE — Discharge Instructions (Signed)
Return late Sunday night to repeat the pregnancy hormone level. Return sooner for any problems. No sex, no tampons, nothing in the vagina until follow up.   Vaginal Bleeding During Pregnancy, First Trimester A small amount of bleeding (spotting) from the vagina is relatively common in early pregnancy. It usually stops on its own. Various things may cause bleeding or spotting in early pregnancy. Some bleeding may be related to the pregnancy, and some may not. In most cases, the bleeding is normal and is not a problem. However, bleeding can also be a sign of something serious. Be sure to tell your health care provider about any vaginal bleeding right away. Some possible causes of vaginal bleeding during the first trimester include:  Infection or inflammation of the cervix.  Growths (polyps) on the cervix.  Miscarriage or threatened miscarriage.  Pregnancy tissue has developed outside of the uterus and in a fallopian tube (tubal pregnancy).  Tiny cysts have developed in the uterus instead of pregnancy tissue (molar pregnancy). HOME CARE INSTRUCTIONS  Watch your condition for any changes. The following actions may help to lessen any discomfort you are feeling:  Follow your health care provider's instructions for limiting your activity. If your health care provider orders bed rest, you may need to stay in bed and only get up to use the bathroom. However, your health care provider may allow you to continue light activity.  If needed, make plans for someone to help with your regular activities and responsibilities while you are on bed rest.  Keep track of the number of pads you use each day, how often you change pads, and how soaked (saturated) they are. Write this down.  Do not use tampons. Do not douche.  Do not have sexual intercourse or orgasms until approved by your health care provider.  If you pass any tissue from your vagina, save the tissue so you can show it to your health care  provider.  Only take over-the-counter or prescription medicines as directed by your health care provider.  Do not take aspirin because it can make you bleed.  Keep all follow-up appointments as directed by your health care provider. SEEK MEDICAL CARE IF:  You have any vaginal bleeding during any part of your pregnancy.  You have cramps or labor pains.  You have a fever, not controlled by medicine. SEEK IMMEDIATE MEDICAL CARE IF:   You have severe cramps in your back or belly (abdomen).  You pass large clots or tissue from your vagina.  Your bleeding increases.  You feel light-headed or weak, or you have fainting episodes.  You have chills.  You are leaking fluid or have a gush of fluid from your vagina.  You pass out while having a bowel movement. MAKE SURE YOU:  Understand these instructions.  Will watch your condition.  Will get help right away if you are not doing well or get worse. Document Released: 02/19/2005 Document Revised: 05/17/2013 Document Reviewed: 01/17/2013 [facility] Patient Information 2015 [place], [place]. This information is not intended to replace advice given to you by your health care provider. Make sure you discuss any questions you have with your health care provider.  Pelvic Rest Pelvic rest is sometimes recommended for women when:   The placenta is partially or completely covering the opening of the cervix (placenta previa).  There is bleeding between the uterine wall and the amniotic sac in the first trimester (subchorionic hemorrhage).  The cervix begins to open without labor starting (incompetent cervix, cervical insufficiency).  The labor is too early (preterm labor). HOME CARE INSTRUCTIONS  Do not have sexual intercourse, stimulation, or an orgasm.  Do not use tampons, douche, or put anything in the vagina.  Do not lift anything over 10 pounds (4.5 kg).  Avoid strenuous activity or straining your pelvic muscles. SEEK MEDICAL CARE  IF:  You have any vaginal bleeding during pregnancy. Treat this as a potential emergency.  You have cramping pain felt low in the stomach (stronger than menstrual cramps).  You notice vaginal discharge (watery, mucus, or bloody).  You have a low, dull backache.  There are regular contractions or uterine tightening. SEEK IMMEDIATE MEDICAL CARE IF: You have vaginal bleeding and have placenta previa.  Document Released: 09/06/2010 Document Revised: 08/04/2011 Document Reviewed: 09/06/2010 [facility] Patient Information 2015 [place], [place]. This information is not intended to replace advice given to you by your health care provider. Make sure you discuss any questions you have with your health care provider.

## 2014-07-30 ENCOUNTER — Encounter (HOSPITAL_COMMUNITY): Payer: Self-pay | Admitting: *Deleted

## 2014-07-30 ENCOUNTER — Inpatient Hospital Stay (HOSPITAL_COMMUNITY)
Admission: AD | Admit: 2014-07-30 | Discharge: 2014-07-30 | Disposition: A | Discharge status: Home / Self Care | Payer: BC Managed Care – PPO | Source: Ambulatory Visit | Attending: Obstetrics | Admitting: Obstetrics

## 2014-07-30 DIAGNOSIS — O26851 Spotting complicating pregnancy, first trimester: Secondary | ICD-10-CM | POA: Insufficient documentation

## 2014-07-30 DIAGNOSIS — Z3A01 Less than 8 weeks gestation of pregnancy: Secondary | ICD-10-CM | POA: Diagnosis not present

## 2014-07-30 DIAGNOSIS — O0281 Inappropriate change in quantitative human chorionic gonadotropin (hCG) in early pregnancy: Secondary | ICD-10-CM

## 2014-07-30 LAB — HCG, QUANTITATIVE, PREGNANCY: hCG, Beta Chain, Quant, S: 85 m[IU]/mL — ABNORMAL HIGH (ref <5)

## 2014-07-30 NOTE — MAU Provider Note (Signed)
History   Chief Complaint:  repeat labs    Christine Ross is  24 y.o. G2P0010 Patient's last menstrual period was 06/26/2014.Marland Kitchen. Patient is here for follow up of quantitative HCG and ongoing surveillance of pregnancy status.   She is 3382w6d weeks gestation  by LMP. History of SAB June 29, 2014. Had D&E. Seen in maternity admissions 07/28/2014 for positive pregnancy test and spotting. Thinks this may be a new pregnancy.   Since her last visit, the patient is without new complaint.   The patient reports bleeding as spotting.  Denies abdominal pain.  General ROS:  negative  Her previous Quantitative HCG values are:  07/28/2014:  72  Physical Exam   Blood pressure 117/97, pulse 72, temperature 97.8 F (36.6 C), last menstrual period 06/26/2014, unknown if currently breastfeeding.  Focused Gynecological Exam: Exam not performed.  Labs: Results for orders placed or performed during the hospital encounter of 07/30/14 (from the past 24 hour(s))  hCG, quantitative, pregnancy   Collection Time: 07/30/14 10:14 AM  Result Value Ref Range   hCG, Beta Chain, Quant, S 85 (H) <5 mIU/mL   Blood type O positive  Ultrasound Studies:   No results found.  Assessment: 5282w6d weeks gestation 1. Inappropriate change in quantitative hCG in early pregnancy   2. Spotting affecting pregnancy in first trimester, antepartum    Plan: Patient requesting to leave and call back for results. Patient call back. Notified inappropriate rise in hCG. Informed of these results are concerning for SAB or ectopic pregnancy. Per consult with Dr. Gaynell FaceMarshall patient does not need to come back for ultrasound today because of very low hCG levels. Should call [facility] in the morning to schedule serial hCGs and ultrasound. Patient instructed to come to maternity admissions at any time for abdominal pain or worsening bleeding. In basket message sent to Dr. Clearance CootsHarper regarding follow-up.  Christine Ross 07/30/2014, 3:11 PM

## 2014-07-30 NOTE — MAU Note (Signed)
Spoke with Ivonne AndrewV Smith CNM in regards to pt, will call pt with results of BHCG level.

## 2014-07-30 NOTE — MAU Note (Signed)
Pt presents to MAU for repeat BHCG for early pregnancy. Pt had a SAB  In January and has not had a menstrual cycle since that pregnancy. Pt denies any pain, reports vaginal spotting.

## 2014-07-31 ENCOUNTER — Other Ambulatory Visit: Payer: BLUE CROSS/BLUE SHIELD

## 2014-07-31 ENCOUNTER — Other Ambulatory Visit: Payer: Self-pay | Admitting: Obstetrics

## 2014-07-31 ENCOUNTER — Encounter: Payer: Self-pay | Admitting: Obstetrics

## 2014-07-31 DIAGNOSIS — Z3201 Encounter for pregnancy test, result positive: Secondary | ICD-10-CM

## 2014-07-31 LAB — GC/CHLAMYDIA PROBE AMP (~~LOC~~) NOT AT ARMC
Chlamydia: NEGATIVE
Neisseria Gonorrhea: NEGATIVE

## 2014-08-08 ENCOUNTER — Inpatient Hospital Stay (HOSPITAL_COMMUNITY)
Admission: AD | Admit: 2014-08-08 | Discharge: 2014-08-08 | Disposition: A | Discharge status: Home / Self Care | Payer: BC Managed Care – PPO | Source: Ambulatory Visit | Attending: Obstetrics | Admitting: Obstetrics

## 2014-08-08 ENCOUNTER — Encounter (HOSPITAL_COMMUNITY): Payer: Self-pay | Admitting: *Deleted

## 2014-08-08 ENCOUNTER — Inpatient Hospital Stay (HOSPITAL_COMMUNITY): Payer: BC Managed Care – PPO

## 2014-08-08 DIAGNOSIS — O031 Delayed or excessive hemorrhage following incomplete spontaneous abortion: Secondary | ICD-10-CM | POA: Diagnosis not present

## 2014-08-08 DIAGNOSIS — Z87891 Personal history of nicotine dependence: Secondary | ICD-10-CM | POA: Insufficient documentation

## 2014-08-08 DIAGNOSIS — O209 Hemorrhage in early pregnancy, unspecified: Secondary | ICD-10-CM | POA: Diagnosis not present

## 2014-08-08 LAB — CBC WITH DIFFERENTIAL/PLATELET
Basophils Absolute: 0 10*3/uL (ref 0.0–0.1)
Basophils Relative: 0 % (ref 0–1)
Eosinophils Absolute: 0.1 10*3/uL (ref 0.0–0.7)
Eosinophils Relative: 1 % (ref 0–5)
HCT: 35.5 % — ABNORMAL LOW (ref 36.0–46.0)
HEMOGLOBIN: 11.8 g/dL — AB (ref 12.0–15.0)
Lymphocytes Relative: 34 % (ref 12–46)
Lymphs Abs: 2.1 10*3/uL (ref 0.7–4.0)
MCH: 29.3 pg (ref 26.0–34.0)
MCHC: 33.2 g/dL (ref 30.0–36.0)
MCV: 88.1 fL (ref 78.0–100.0)
MONO ABS: 0.3 10*3/uL (ref 0.1–1.0)
MONOS PCT: 5 % (ref 3–12)
Neutro Abs: 3.7 10*3/uL (ref 1.7–7.7)
Neutrophils Relative %: 60 % (ref 43–77)
Platelets: 268 10*3/uL (ref 150–400)
RBC: 4.03 MIL/uL (ref 3.87–5.11)
RDW: 12.8 % (ref 11.5–15.5)
WBC: 6.3 10*3/uL (ref 4.0–10.5)

## 2014-08-08 LAB — URINALYSIS, ROUTINE W REFLEX MICROSCOPIC
Bilirubin Urine: NEGATIVE
Glucose, UA: NEGATIVE mg/dL
Ketones, ur: NEGATIVE mg/dL
NITRITE: NEGATIVE
Protein, ur: 30 mg/dL — AB
Specific Gravity, Urine: >1.03 — ABNORMAL HIGH (ref 1.005–1.030)
UROBILINOGEN UA: 0.2 mg/dL (ref 0.0–1.0)
pH: 6 (ref 5.0–8.0)

## 2014-08-08 LAB — URINE MICROSCOPIC-ADD ON

## 2014-08-08 LAB — HCG, QUANTITATIVE, PREGNANCY: HCG, BETA CHAIN, QUANT, S: 82 m[IU]/mL — AB (ref <5)

## 2014-08-08 MED ORDER — MISOPROSTOL 200 MCG PO TABS: ORAL_TABLET | ORAL | Status: DC

## 2014-08-08 MED ORDER — OXYCODONE-ACETAMINOPHEN 5-325 MG PO TABS: 1.0000 | ORAL_TABLET | Freq: Four times a day (QID) | ORAL | Status: DC | PRN

## 2014-08-08 NOTE — MAU Note (Signed)
Noticed bleeding about an hour ago.. Bright red. Some abd pain last night but none today. Has used one pad.

## 2014-08-08 NOTE — Discharge Instructions (Signed)
Incomplete Miscarriage A miscarriage is the sudden loss of an unborn baby (fetus) before the 20th week of pregnancy. In an incomplete miscarriage, parts of the fetus or placenta (afterbirth) remain in the body.  Having a miscarriage can be an emotional experience. Talk with your health care provider about any questions you may have about miscarrying, the grieving process, and your future pregnancy plans. CAUSES   Problems with the fetal chromosomes that make it impossible for the baby to develop normally. Problems with the baby's genes or chromosomes are most often the result of errors that occur by chance as the embryo divides and grows. The problems are not inherited from the parents.  Infection of the cervix or uterus.  Hormone problems.  Problems with the cervix, such as having an incompetent cervix. This is when the tissue in the cervix is not strong enough to hold the pregnancy.  Problems with the uterus, such as an abnormally shaped uterus, uterine fibroids, or congenital abnormalities.  Certain medical conditions.  Smoking, drinking alcohol, or taking illegal drugs.  Trauma. SYMPTOMS   Vaginal bleeding or spotting, with or without cramps or pain.  Pain or cramping in the abdomen or lower back.  Passing fluid, tissue, or blood clots from the vagina. DIAGNOSIS  Your health care provider will perform a physical exam. You may also have an ultrasound to confirm the miscarriage. Blood or urine tests may also be ordered. TREATMENT   Usually, a dilation and curettage (D&C) procedure is performed. During a D&C procedure, the cervix is widened (dilated) and any remaining fetal or placental tissue is gently removed from the uterus.  Antibiotic medicines are prescribed if there is an infection. Other medicines may be given to reduce the size of the uterus (contract) if there is a lot of bleeding.  If you have Rh negative blood and your baby was Rh positive, you will need a Rho (D)  immune globulin shot. This shot will protect any future baby from having Rh blood problems in future pregnancies.  You may be confined to bed rest. This means you should stay in bed and only get up to use the bathroom. HOME CARE INSTRUCTIONS   Rest as directed by your health care provider.  Restrict activity as directed by your health care provider. You may be allowed to continue light activity if curettage was not done but you require further treatment.  Keep track of the number of pads you use each day. Keep track of how soaked (saturated) they are. Record this information.  Do not  use tampons.  Do not douche or have sexual intercourse until approved by your health care provider.  Keep all follow-up appointments for reevaluation and continuing management.  Only take over-the-counter or prescription medicines for pain, fever, or discomfort as directed by your health care provider.  Take antibiotic medicine as directed by your health care provider. Make sure you finish it even if you start to feel better. SEEK IMMEDIATE MEDICAL CARE IF:   You experience severe cramps in your stomach, back, or abdomen.  You have an unexplained temperature (make sure to record these temperatures).  You pass large clots or tissue (save these for your health care provider to inspect).  Your bleeding increases.  You become light-headed, weak, or have fainting episodes. MAKE SURE YOU:   Understand these instructions.  Will watch your condition.  Will get help right away if you are not doing well or get worse. Document Released: 05/12/2005 Document Revised: 09/26/2013 Document Reviewed:   12/09/2012 ExitCare Patient Information 2015 [employer]. This information is not intended to replace advice given to you by your health care provider. Make sure you discuss any questions you have with your health care provider.  

## 2014-08-08 NOTE — Progress Notes (Signed)
Chief Complaint: Vaginal Bleeding   First Provider Initiated Contact with Patient 08/08/14 1603     SUBJECTIVE HPI: Christine Ross is a 24 y.o. G2P0010 at [redacted]w[redacted]d by LMP with with vaginal bleeding.  She has a past medical history of SAB with D&E on 06/29/14 and inappropriate change in hCG for early pregnancy from 07/28/14 to 07/30/14. The bleeding was noticed this morning, described and heavy and dark, but not enough to fill an entire pad. She endorses pelvic pain last night. She denies fever, hematuria, nausea, vomiting, diarrhea, vaginal itching, and vaginal discharge.   Past Medical History  Diagnosis Date  . Insomnia   . Depression     age 12yo, resolved  . Wears glasses   . Routine gynecological examination     Dr. Jean Rosenthal  . Eczema   . Chlamydia contact, treated 02-2014   OB History  Gravida Para Term Preterm AB SAB TAB Ectopic Multiple Living     # Outcome Date GA Lbr Len/2nd Weight Sex Delivery Anes PTL Lv  2 Current           1 SAB 06/29/14             Past Surgical History  Procedure Laterality Date  . Wisdom tooth extraction    . Dilation and evacuation N/A 06/29/2014    Procedure: DILATATION AND EVACUATION;  Surgeon: Brock Bad, MD;  Location: [facility];  Service: Gynecology;  Laterality: N/A;   History   Social History  . Marital Status: Single    Spouse Name: N/A  . Number of Children: N/A  . Years of Education: N/A   Occupational History  . Not on file.   Social History Main Topics  . Smoking status: Former Smoker    Types: Cigars  . Smokeless tobacco: Not on file  . Alcohol Use: No     Comment: social  . Drug Use: No  . Sexual Activity:    Partners: Male    Pharmacist, hospital Protection: None   Other Topics Concern  . Not on file   Social History Narrative   Single, no prior pregnancies, in criminal justice program at [employer]   No current facility-administered medications on file prior to encounter.    Current Outpatient Prescriptions on File Prior to Encounter  Medication Sig Dispense Refill  . ibuprofen (ADVIL,MOTRIN) 600 MG tablet Take 1 tablet (600 mg total) by mouth every 6 (six) hours as needed. (Patient not taking: Reported on 07/17/2014) 30 tablet 0  . promethazine (PHENERGAN) 25 MG tablet Take 1 tablet (25 mg total) by mouth every 6 (six) hours as needed for nausea or vomiting. (Patient not taking: Reported on 07/17/2014) 10 tablet 0   Allergies  Allergen Reactions  . Nickel Rash    ROS: Positive for vaginal bleeding and pelvic pain. Negative for fever, hematuria, nausea, vomiting, diarrhea, vaginal itching, and vaginal discharge.  OBJECTIVE Blood pressure 124/72, pulse 82, temperature 97.9 F (36.6 C), resp. rate 18, height 5' (1.524 m), weight 58.514 kg (129 lb), last menstrual period 06/26/2014. GENERAL: Well-developed, well-nourished female in no acute distress.  HEENT: Normocephalic HEART: normal rate RESP: normal effort ABDOMEN: Soft, non-tender EXTREMITIES: Nontender, no edema NEURO: Alert and oriented SPECULUM EXAM: NEFG, vaginal bleeding, cervix clean BIMANUAL: cervix non-tender; uterus normal size, no adnexal tenderness or masses  LAB RESULTS 07/28/14 hCG: 72 mIU/mL 07/30/14 hCG: 85 mIU/mL 08/08/14 hCG:  82 mIU/mL Results for orders  placed or performed during the hospital encounter of 08/08/14 (from the past 24 hour(s))  Urinalysis, Routine w reflex microscopic     Status: Abnormal   Collection Time: 08/08/14  3:40 PM  Result Value Ref Range   Color, Urine YELLOW YELLOW   APPearance HAZY (A) CLEAR   Specific Gravity, Urine >1.030 (H) 1.005 - 1.030   pH 6.0 5.0 - 8.0   Glucose, UA NEGATIVE NEGATIVE mg/dL   Hgb urine dipstick LARGE (A) NEGATIVE   Bilirubin Urine NEGATIVE NEGATIVE   Ketones, ur NEGATIVE NEGATIVE mg/dL   Protein, ur 30 (A) NEGATIVE mg/dL   Urobilinogen, UA 0.2 0.0 - 1.0 mg/dL   Nitrite NEGATIVE NEGATIVE   Leukocytes, UA SMALL (A) NEGATIVE   Urine microscopic-add on     Status: Abnormal   Collection Time: 08/08/14  3:40 PM  Result Value Ref Range   Squamous Epithelial / LPF FEW (A) RARE   WBC, UA 3-6 <3 WBC/hpf   RBC / HPF 0-2 <3 RBC/hpf   Bacteria, UA FEW (A) RARE   Urine-Other MUCOUS PRESENT   CBC with Differential/Platelet     Status: Abnormal   Collection Time: 08/08/14  4:35 PM  Result Value Ref Range   WBC 6.3 4.0 - 10.5 K/uL   RBC 4.03 3.87 - 5.11 MIL/uL   Hemoglobin 11.8 (L) 12.0 - 15.0 g/dL   HCT 09.835.5 (L) 11.936.0 - 14.746.0 %   MCV 88.1 78.0 - 100.0 fL   MCH 29.3 26.0 - 34.0 pg   MCHC 33.2 30.0 - 36.0 g/dL   RDW 82.912.8 56.211.5 - 13.015.5 %   Platelets 268 150 - 400 K/uL   Neutrophils Relative % 60 43 - 77 %   Neutro Abs 3.7 1.7 - 7.7 K/uL   Lymphocytes Relative 34 12 - 46 %   Lymphs Abs 2.1 0.7 - 4.0 K/uL   Monocytes Relative 5 3 - 12 %   Monocytes Absolute 0.3 0.1 - 1.0 K/uL   Eosinophils Relative 1 0 - 5 %   Eosinophils Absolute 0.1 0.0 - 0.7 K/uL   Basophils Relative 0 0 - 1 %   Basophils Absolute 0.0 0.0 - 0.1 K/uL  hCG, quantitative, pregnancy     Status: Abnormal   Collection Time: 08/08/14  4:35 PM  Result Value Ref Range   hCG, Beta Chain, Quant, S 82 (H) <5 mIU/mL    IMAGING Kore[place] Ob Transvaginal  08/08/2014   CLINICAL DATA:  Status post D & E on 06/29/2014. Pathology showed chorionic villi and findings consistent with retained products of conception. Increasing quantitative beta HCG.  EXAM: TRANSVAGINAL OB ULTRASOUND  TECHNIQUE: Transvaginal ultrasound was performed for complete evaluation of the gestation as well as the maternal uterus, adnexal regions, and pelvic cul-de-sac.  COMPARISON:  06/28/2014  FINDINGS: Intrauterine gestational sac: None  Yolk sac:  None  Embryo:  None  Maternal uterus/adnexae: Within the uterine fundus there is heterogeneous endometrial material. The fundal endometrium has a similar appearance to the previous exam. The lower uterine segment shows interval evacuation. The endometrial  thickness is 2.1 cm in the fundal region. Color Doppler was not used to assess the endometrium because of the possibility of early pregnancy.  The ovaries have a normal appearance.  No free pelvic fluid.  IMPRESSION: 1. Heterogeneous appearance of the fundal endometrium, most consistent with retained products of conception. 2. No evidence for adnexal pregnancy or intrauterine gestational sac. 3. Coexisting early intrauterine/early adnexal pregnancy and retained products of conception cannot be  excluded.   Electronically Signed   By: Norva Pavlov M.D.   On: 08/08/2014 17:57    MAU COURSE - UA: Specific gravity >1.030, large Hgb, 30 mg/dL protein, small leukocytes, 3-6 WBCs - CBC w Diff: Hgb 11.8, HCT 35.5  - hCG Quantitative: 82 (minimal change) - US OB Transvaginal Less 14 Weeks  ASSESSMENT 1. Vaginal bleeding before [redacted] weeks gestation   2. Retained products of conception  PLAN Discharge home on misoprostol 800 mcg vaginally once and oxycodone-acetaminophen as needed for pain.     Follow-up Information    Follow up with HARPER,CHARLES A, MD. Call in 2 weeks.   Specialty:  Obstetrics and Gynecology   Why:  Call to make an appointment   Contact information:   [address] [place] [address] [phone]       Follow up with [facility] [place] [facility] ADMISS[facility].   Why:  As needed, If symptoms worsen   Contact information:   [address] [id number] mc [place] [address] [phone]       Medication List    STOP taking these medications        doxycycline 100 MG capsule  Commonly known as:  VIBRAMYCIN     metroNIDAZOLE 500 MG tablet  Commonly known as:  FLAGYL      TAKE these medications        acetaminophen 325 MG tablet  Commonly known as:  TYLENOL  Take 650 mg by mouth every 6 (six) hours as needed for mild pain.     ibuprofen 600 MG tablet  Commonly known as:  ADVIL,MOTRIN  Take 1 tablet (600 mg  total) by mouth every 6 (six) hours as needed.     misoprostol 200 MCG tablet  Commonly known as:  CYTOTEC  Place 4 tabs (800 mcg) vaginally once     oxyCODONE-acetaminophen 5-325 MG per tablet  Commonly known as:  PERCOCET/ROXICET  Take 1-2 tablets by mouth every 6 (six) hours as needed for severe pain.     prenatal multivitamin Tabs tablet  Take 1 tablet by mouth daily at 12 noon.     promethazine 25 MG tablet  Commonly known as:  PHENERGAN  Take 1 tablet (25 mg total) by mouth every 6 (six) hours as needed for nausea or vomiting.         Lillia Mountain, Med Student 08/08/2014  6:26 PM

## 2014-08-08 NOTE — MAU Provider Note (Signed)
History     CSN: [id number]  Arrival date and time: 08/08/14 1446   First Provider Initiated Contact with Patient 08/08/14 1603      Chief Complaint  Patient presents with  . Vaginal Bleeding   HPI  [name] is a 24 y.o. G2P0010 who presents to MAU today with complaint of vaginal bleeding. The patient states onset of heavy, dark blood noted about 2.5 hours prior to arrival. She states minimal cramping noted last night, but none today. She denies vaginal discharge, fever or N/V today. Patient was diagnosed with missed AB on 06/29/14 and had D&E. She had +UPT on 07/28/14 and was concerned about a possible new pregnancy. She came to MAU on 07/28/14 and had hCG of 72. She returned for 48 hour follow-up and hCG was 85. The patient was seen in the office by Dr. Clearance Coots recently and advised to return for labs in 1 week and if hCG has risen enough, [place] would be done then.   OB History    Gravida Para Term Preterm AB TAB SAB Ectopic Multiple Living        Past Medical History  Diagnosis Date  . Insomnia   . Depression     age 71yo, resolved  . Wears glasses   . Routine gynecological examination     Dr. Jean Rosenthal  . Eczema   . Chlamydia contact, treated 02-2014    Past Surgical History  Procedure Laterality Date  . Wisdom tooth extraction    . Dilation and evacuation N/A 06/29/2014    Procedure: DILATATION AND EVACUATION;  Surgeon: Brock Bad, MD;  Location: [facility];  Service: Gynecology;  Laterality: N/A;    History reviewed. No pertinent family history.  History  Substance Use Topics  . Smoking status: Former Smoker    Types: Cigars  . Smokeless tobacco: Not on file  . Alcohol Use: No     Comment: social    Allergies:  Allergies  Allergen Reactions  . Nickel Rash    No prescriptions prior to admission    Review of Systems  Constitutional: Negative for fever and malaise/fatigue.  Gastrointestinal: Negative for nausea, vomiting,  abdominal pain, diarrhea and constipation.  Genitourinary: Negative for dysuria, urgency and frequency.       + vaginal bleeding Neg - vaginal discharge   Physical Exam   Blood pressure 113/70, pulse 73, temperature 98.3 F (36.8 C), temperature source Oral, resp. rate 16, height 5' (1.524 m), weight 129 lb (58.514 kg), last menstrual period 06/26/2014, unknown if currently breastfeeding.  Physical Exam  Constitutional: She is oriented to person, place, and time. She appears well-developed and well-nourished. No distress.  HENT:  Head: Normocephalic.  Cardiovascular: Normal rate.   Respiratory: Effort normal.  GI: Soft.  Genitourinary: Uterus is not enlarged and not tender. Cervix exhibits no motion tenderness, no discharge and no friability. Right adnexum displays no mass and no tenderness. Left adnexum displays no mass and no tenderness. There is bleeding (scant dark red blood noted in the vaginal vault) in the vagina. No vaginal discharge found.  Cervix: Closed, thick  Neurological: She is alert and oriented to person, place, and time.  Skin: Skin is warm and dry. No erythema.  Psychiatric: She has a normal mood and affect.   Results for orders placed or performed during the hospital encounter of 08/08/14 (from the past 24 hour(s))  Urinalysis, Routine w reflex microscopic  Status: Abnormal   Collection Time: 08/08/14  3:40 PM  Result Value Ref Range   Color, Urine YELLOW YELLOW   APPearance HAZY (A) CLEAR   Specific Gravity, Urine >1.030 (H) 1.005 - 1.030   pH 6.0 5.0 - 8.0   Glucose, UA NEGATIVE NEGATIVE mg/dL   Hgb urine dipstick LARGE (A) NEGATIVE   Bilirubin Urine NEGATIVE NEGATIVE   Ketones, ur NEGATIVE NEGATIVE mg/dL   Protein, ur 30 (A) NEGATIVE mg/dL   Urobilinogen, UA 0.2 0.0 - 1.0 mg/dL   Nitrite NEGATIVE NEGATIVE   Leukocytes, UA SMALL (A) NEGATIVE  Urine microscopic-add on     Status: Abnormal   Collection Time: 08/08/14  3:40 PM  Result Value Ref Range    Squamous Epithelial / LPF FEW (A) RARE   WBC, UA 3-6 <3 WBC/hpf   RBC / HPF 0-2 <3 RBC/hpf   Bacteria, UA FEW (A) RARE   Urine-Other MUCOUS PRESENT   CBC with Differential/Platelet     Status: Abnormal   Collection Time: 08/08/14  4:35 PM  Result Value Ref Range   WBC 6.3 4.0 - 10.5 K/uL   RBC 4.03 3.87 - 5.11 MIL/uL   Hemoglobin 11.8 (L) 12.0 - 15.0 g/dL   HCT 40.935.5 (L) 81.136.0 - 91.446.0 %   MCV 88.1 78.0 - 100.0 fL   MCH 29.3 26.0 - 34.0 pg   MCHC 33.2 30.0 - 36.0 g/dL   RDW 78.212.8 95.611.5 - 21.315.5 %   Platelets 268 150 - 400 K/uL   Neutrophils Relative % 60 43 - 77 %   Neutro Abs 3.7 1.7 - 7.7 K/uL   Lymphocytes Relative 34 12 - 46 %   Lymphs Abs 2.1 0.7 - 4.0 K/uL   Monocytes Relative 5 3 - 12 %   Monocytes Absolute 0.3 0.1 - 1.0 K/uL   Eosinophils Relative 1 0 - 5 %   Eosinophils Absolute 0.1 0.0 - 0.7 K/uL   Basophils Relative 0 0 - 1 %   Basophils Absolute 0.0 0.0 - 0.1 K/uL  hCG, quantitative, pregnancy     Status: Abnormal   Collection Time: 08/08/14  4:35 PM  Result Value Ref Range   hCG, Beta Chain, Quant, S 82 (H) <5 mIU/mL   [place] Ob Transvaginal  08/08/2014   CLINICAL DATA:  Status post D & E on 06/29/2014. Pathology showed chorionic villi and findings consistent with retained products of conception. Increasing quantitative beta HCG.  EXAM: TRANSVAGINAL OB ULTRASOUND  TECHNIQUE: Transvaginal ultrasound was performed for complete evaluation of the gestation as well as the maternal uterus, adnexal regions, and pelvic cul-de-sac.  COMPARISON:  06/28/2014  FINDINGS: Intrauterine gestational sac: None  Yolk sac:  None  Embryo:  None  Maternal uterus/adnexae: Within the uterine fundus there is heterogeneous endometrial material. The fundal endometrium has a similar appearance to the previous exam. The lower uterine segment shows interval evacuation. The endometrial thickness is 2.1 cm in the fundal region. Color Doppler was not used to assess the endometrium because of the possibility of  early pregnancy.  The ovaries have a normal appearance.  No free pelvic fluid.  IMPRESSION: 1. Heterogeneous appearance of the fundal endometrium, most consistent with retained products of conception. 2. No evidence for adnexal pregnancy or intrauterine gestational sac. 3. Coexisting early intrauterine/early adnexal pregnancy and retained products of conception cannot be excluded.   Electronically Signed   By: Norva PavlovElizabeth  Brown M.D.   On: 08/08/2014 17:57    MAU Course  Procedures None  MDM UA, CBC, quant hCG and [place] today Discussed patient with Dr. Clearance Coots. Recommends Cytotec for retained products and follow-up in the office in ~ 2 weeks Discussed that even if this is a new pregnancy it is not normal in development and therefore Cytotec is a safe option for treatment of the more likely diagnosis of retained POC after D&E Assessment and Plan  A: Retained POC vs abnormally developing new pregnancy  P: Discharge home Rx for Cytotec and Percocet given to patient Patient advised to also continue previously prescribed Ibuprofen and Phenergan as needed.  Bleeding/Ectopic precautions discussed Patient advised to call [facility] for an appointment to follow-up in 2 weeks or sooner PRN Patient may return to MAU as needed or if her condition were to change or worsen   Marny Lowenstein, PA-C  08/08/2014, 6:49 PM

## 2014-08-10 ENCOUNTER — Other Ambulatory Visit: Payer: Self-pay | Admitting: Obstetrics and Gynecology

## 2014-08-10 DIAGNOSIS — Z419 Encounter for procedure for purposes other than remedying health state, unspecified: Secondary | ICD-10-CM

## 2014-08-14 ENCOUNTER — Ambulatory Visit: Payer: BLUE CROSS/BLUE SHIELD | Admitting: Obstetrics

## 2014-08-15 ENCOUNTER — Encounter: Payer: Self-pay | Admitting: *Deleted

## 2014-08-15 MED ORDER — DEXTROSE 5 % IV SOLN
100.0000 mg | Freq: Once | INTRAVENOUS | Status: AC
  Administered 2014-08-16: 100 mg via INTRAVENOUS
  Filled 2014-08-15: qty 100

## 2014-08-16 ENCOUNTER — Encounter (HOSPITAL_COMMUNITY)
Admission: RE | Disposition: A | Discharge status: Home / Self Care | Payer: Self-pay | Source: Ambulatory Visit | Attending: Obstetrics and Gynecology

## 2014-08-16 ENCOUNTER — Encounter (HOSPITAL_COMMUNITY): Payer: Self-pay | Admitting: *Deleted

## 2014-08-16 ENCOUNTER — Ambulatory Visit (HOSPITAL_COMMUNITY): Payer: BC Managed Care – PPO | Admitting: Certified Registered Nurse Anesthetist

## 2014-08-16 ENCOUNTER — Ambulatory Visit (HOSPITAL_COMMUNITY)
Admission: RE | Admit: 2014-08-16 | Discharge: 2014-08-16 | Disposition: A | Discharge status: Home / Self Care | Payer: BC Managed Care – PPO | Source: Ambulatory Visit | Attending: Obstetrics and Gynecology | Admitting: Obstetrics and Gynecology

## 2014-08-16 DIAGNOSIS — O021 Missed abortion: Secondary | ICD-10-CM | POA: Diagnosis not present

## 2014-08-16 DIAGNOSIS — Z87891 Personal history of nicotine dependence: Secondary | ICD-10-CM | POA: Insufficient documentation

## 2014-08-16 DIAGNOSIS — O034 Incomplete spontaneous abortion without complication: Secondary | ICD-10-CM

## 2014-08-16 HISTORY — PX: DILATION AND EVACUATION: SHX1459

## 2014-08-16 HISTORY — PX: OPERATIVE ULTRASOUND: SHX5996

## 2014-08-16 LAB — CBC
HCT: 34.4 % — ABNORMAL LOW (ref 36.0–46.0)
Hemoglobin: 11.1 g/dL — ABNORMAL LOW (ref 12.0–15.0)
MCH: 28.7 pg (ref 26.0–34.0)
MCHC: 32.3 g/dL (ref 30.0–36.0)
MCV: 88.9 fL (ref 78.0–100.0)
PLATELETS: 243 10*3/uL (ref 150–400)
RBC: 3.87 MIL/uL (ref 3.87–5.11)
RDW: 13.2 % (ref 11.5–15.5)
WBC: 5.8 10*3/uL (ref 4.0–10.5)

## 2014-08-16 SURGERY — DILATION AND EVACUATION, UTERUS
Anesthesia: Monitor Anesthesia Care | Site: Vagina

## 2014-08-16 MED ORDER — PROPOFOL INFUSION 10 MG/ML OPTIME
INTRAVENOUS | Status: DC | PRN
  Administered 2014-08-16: 75 ug/kg/min via INTRAVENOUS

## 2014-08-16 MED ORDER — ONDANSETRON HCL 4 MG/2ML IJ SOLN
INTRAMUSCULAR | Status: AC
  Filled 2014-08-16: qty 2

## 2014-08-16 MED ORDER — PROPOFOL 10 MG/ML IV BOLUS
INTRAVENOUS | Status: AC
  Filled 2014-08-16: qty 40

## 2014-08-16 MED ORDER — DOXYCYCLINE HYCLATE 100 MG PO TABS: 100.0000 mg | ORAL_TABLET | Freq: Two times a day (BID) | ORAL | Status: DC

## 2014-08-16 MED ORDER — SCOPOLAMINE 1 MG/3DAYS TD PT72
MEDICATED_PATCH | TRANSDERMAL | Status: AC
  Filled 2014-08-16: qty 1

## 2014-08-16 MED ORDER — DEXAMETHASONE SODIUM PHOSPHATE 4 MG/ML IJ SOLN
INTRAMUSCULAR | Status: AC
  Filled 2014-08-16: qty 1

## 2014-08-16 MED ORDER — LIDOCAINE HCL (CARDIAC) 20 MG/ML IV SOLN
INTRAVENOUS | Status: AC
  Filled 2014-08-16: qty 5

## 2014-08-16 MED ORDER — ONDANSETRON HCL 4 MG/2ML IJ SOLN
INTRAMUSCULAR | Status: DC | PRN
  Administered 2014-08-16: 4 mg via INTRAVENOUS

## 2014-08-16 MED ORDER — LIDOCAINE HCL (CARDIAC) 20 MG/ML IV SOLN
INTRAVENOUS | Status: DC | PRN
  Administered 2014-08-16: 80 mg via INTRAVENOUS

## 2014-08-16 MED ORDER — LACTATED RINGERS IV SOLN
INTRAVENOUS | Status: DC
  Administered 2014-08-16 (×2): via INTRAVENOUS

## 2014-08-16 MED ORDER — FENTANYL CITRATE 0.05 MG/ML IJ SOLN
INTRAMUSCULAR | Status: DC | PRN
  Administered 2014-08-16: 100 ug via INTRAVENOUS

## 2014-08-16 MED ORDER — MIDAZOLAM HCL 2 MG/2ML IJ SOLN
INTRAMUSCULAR | Status: DC | PRN
  Administered 2014-08-16: 2 mg via INTRAVENOUS

## 2014-08-16 MED ORDER — SCOPOLAMINE 1 MG/3DAYS TD PT72
1.0000 | MEDICATED_PATCH | Freq: Once | TRANSDERMAL | Status: DC
  Administered 2014-08-16: 1.5 mg via TRANSDERMAL

## 2014-08-16 MED ORDER — KETOROLAC TROMETHAMINE 30 MG/ML IJ SOLN
INTRAMUSCULAR | Status: DC | PRN
  Administered 2014-08-16: 30 mg via INTRAMUSCULAR
  Administered 2014-08-16: 30 mg via INTRAVENOUS

## 2014-08-16 MED ORDER — MIDAZOLAM HCL 2 MG/2ML IJ SOLN
INTRAMUSCULAR | Status: AC
  Filled 2014-08-16: qty 2

## 2014-08-16 MED ORDER — CHLOROPROCAINE HCL 1 % IJ SOLN
INTRAMUSCULAR | Status: DC | PRN
  Administered 2014-08-16: 20 mL

## 2014-08-16 MED ORDER — DEXAMETHASONE SODIUM PHOSPHATE 10 MG/ML IJ SOLN
INTRAMUSCULAR | Status: DC | PRN
  Administered 2014-08-16: 4 mg via INTRAVENOUS

## 2014-08-16 MED ORDER — FENTANYL CITRATE 0.05 MG/ML IJ SOLN
INTRAMUSCULAR | Status: AC
  Filled 2014-08-16: qty 2

## 2014-08-16 MED ORDER — CHLOROPROCAINE HCL 1 % IJ SOLN
INTRAMUSCULAR | Status: AC
  Filled 2014-08-16: qty 30

## 2014-08-16 MED ORDER — FENTANYL CITRATE 0.05 MG/ML IJ SOLN: 25.0000 ug | INTRAMUSCULAR | Status: DC | PRN

## 2014-08-16 MED ORDER — PROPOFOL 10 MG/ML IV EMUL
INTRAVENOUS | Status: DC | PRN
  Administered 2014-08-16: 10 mg via INTRAVENOUS
  Administered 2014-08-16: 20 mg via INTRAVENOUS
  Administered 2014-08-16 (×3): 10 mg via INTRAVENOUS

## 2014-08-16 SURGICAL SUPPLY — 19 items
CATH ROBINSON RED A/P 16FR (CATHETERS) ×2 IMPLANT
CLOTH BEACON ORANGE TIMEOUT ST (SAFETY) ×2 IMPLANT
DECANTER SPIKE VIAL GLASS SM (MISCELLANEOUS) ×2 IMPLANT
DILATOR CANAL MILEX (MISCELLANEOUS) ×2 IMPLANT
GLOVE BIOGEL PI IND STRL 7.0 (GLOVE) ×2 IMPLANT
GLOVE BIOGEL PI INDICATOR 7.0 (GLOVE) ×2
GLOVE ECLIPSE 6.5 STRL STRAW (GLOVE) ×2 IMPLANT
GOWN STRL REUS W/TWL LRG LVL3 (GOWN DISPOSABLE) ×4 IMPLANT
KIT BERKELEY 1ST TRIMESTER 3/8 (MISCELLANEOUS) ×2 IMPLANT
NS IRRIG 1000ML POUR BTL (IV SOLUTION) ×2 IMPLANT
PACK VAGINAL MINOR WOMEN LF (CUSTOM PROCEDURE TRAY) ×2 IMPLANT
PAD OB MATERNITY 4.3X12.25 (PERSONAL CARE ITEMS) ×2 IMPLANT
PAD PREP 24X48 CUFFED NSTRL (MISCELLANEOUS) ×2 IMPLANT
SET BERKELEY SUCTION TUBING (SUCTIONS) ×2 IMPLANT
TOWEL OR 17X24 6PK STRL BLUE (TOWEL DISPOSABLE) ×4 IMPLANT
VACURETTE 10 RIGID CVD (CANNULA) IMPLANT
VACURETTE 7MM CVD STRL WRAP (CANNULA) ×2 IMPLANT
VACURETTE 8 RIGID CVD (CANNULA) IMPLANT
VACURETTE 9 RIGID CVD (CANNULA) IMPLANT

## 2014-08-16 NOTE — Brief Op Note (Signed)
08/16/2014  8:34 AM  PATIENT:  Christine CellaArnetia Belmares  24 y.o. female  PRE-OPERATIVE DIAGNOSIS:  Retained Products of Conception, Rule Out Molar Pregnancy, Abnormal HCG  POST-OPERATIVE DIAGNOSIS:  same  PROCEDURE:  Ultrasound guided suction dilation and curettage  SURGEON:  Surgeon(s) and Role:    * Maxie BetterSheronette Jeyli Zwicker, MD - Primary  PHYSICIAN ASSISTANT:   ASSISTANTS: none   ANESTHESIA:   paracervical block and MAC   Findings:  large amount of thick/hard POC   EBL:  Total I/O In: 1400 [I.V.:1400] Out: 125 [Urine:125]  BLOOD ADMINISTERED:none  DRAINS: none   LOCAL MEDICATIONS USED:  OTHER nesicaine  SPECIMEN:  Source of Specimen:  retained POC  DISPOSITION OF SPECIMEN:  PATHOLOGY  COUNTS:  YES  TOURNIQUET:  * No tourniquets in log *  DICTATION: .Other Dictation: Dictation Number [id number]  PLAN OF CARE: Discharge to home after PACU  PATIENT DISPOSITION:  PACU - hemodynamically stable.   Delay start of Pharmacological VTE agent (>24hrs) due to surgical blood loss or risk of bleeding: no

## 2014-08-16 NOTE — OR Nursing (Signed)
U/S in at case start 0754 to guide Dr. Cherly Hensenousins with D&E

## 2014-08-16 NOTE — Discharge Instructions (Signed)

## 2014-08-16 NOTE — OR Nursing (Signed)
U/S paged at 0730

## 2014-08-16 NOTE — Anesthesia Postprocedure Evaluation (Signed)
  Anesthesia Post-op Note  Patient: Christine Ross  Procedure(s) Performed: Procedure(s): DILATATION AND EVACUATION (N/A) OPERATIVE ULTRASOUND (N/A) Patient is awake and responsive. Pain and nausea are reasonably well controlled. Vital signs are stable and clinically acceptable. Oxygen saturation is clinically acceptable. There are no apparent anesthetic complications at this time. Patient is ready for discharge.

## 2014-08-16 NOTE — Anesthesia Preprocedure Evaluation (Signed)
Anesthesia Evaluation  Patient identified by MRN, date of birth, ID band Patient awake    Reviewed: Allergy & Precautions, H&P , Patient's Chart, lab work & pertinent test results, reviewed documented beta blocker date and time   Airway Mallampati: II  TM Distance: >3 FB Neck ROM: full    Dental no notable dental hx.    Pulmonary former smoker,  breath sounds clear to auscultation  Pulmonary exam normal       Cardiovascular Rhythm:regular Rate:Normal     Neuro/Psych    GI/Hepatic   Endo/Other    Renal/GU      Musculoskeletal   Abdominal   Peds  Hematology   Anesthesia Other Findings   Reproductive/Obstetrics                             Anesthesia Physical Anesthesia Plan  ASA: II  Anesthesia Plan: MAC   Post-op Pain Management:    Induction: Intravenous  Airway Management Planned: Mask and Natural Airway  Additional Equipment:   Intra-op Plan:   Post-operative Plan:   Informed Consent: I have reviewed the patients History and Physical, chart, labs and discussed the procedure including the risks, benefits and alternatives for the proposed anesthesia with the patient or authorized representative who has indicated his/her understanding and acceptance.   Dental Advisory Given  Plan Discussed with: CRNA and Surgeon  Anesthesia Plan Comments: (Discussed sedation and potential to need to place airway or ETT if warranted by clinical changes intra-operatively. We will start procedure as MAC.)        Anesthesia Quick Evaluation  

## 2014-08-16 NOTE — Transfer of Care (Signed)
Immediate Anesthesia Transfer of Care Note  Patient: Christine Ross  Procedure(s) Performed: Procedure(s): DILATATION AND EVACUATION (N/A) OPERATIVE ULTRASOUND (N/A)  Patient Location: PACU  Anesthesia Type:MAC  Level of Consciousness: awake, alert  and oriented  Airway & Oxygen Therapy: Patient Spontanous Breathing and Patient connected to nasal cannula oxygen  Post-op Assessment: Report given to RN, Post -op Vital signs reviewed and stable and Patient moving all extremities  Post vital signs: Reviewed and stable  Last Vitals:  Filed Vitals:   08/16/14 0649  BP: 113/74  Pulse: 77  Temp: 36.8 C  Resp: 18    Complications: No apparent anesthesia complications

## 2014-08-16 NOTE — Op Note (Signed)
NAME:  Christine CellaWASHINGTON, Sarahanne               ACCOUNT NO.:  MEDICAL RECORD NO.:  [id number]  LOCATION:                                 FACILITY:  PHYSICIAN:  Maxie BetterSheronette Jsoeph Podesta, M.D.DATE OF BIRTH:  1991/02/17  DATE OF PROCEDURE:  08/16/2014 DATE OF DISCHARGE:                              OPERATIVE REPORT   PREOPERATIVE DIAGNOSES:  Retained products of conception, rule out molar pregnancy, abnormal HCG.  PROCEDURES:  Ultrasound-guided suction dilation, and curettage.  POSTOPERATIVE DIAGNOSES:  Retained products of conception, rule out molar pregnancy, abnormal HCG.  ANESTHESIA:  MAC ,paracervical block.  SURGEON:  Maxie BetterSheronette Simone Rodenbeck, MD.  ASSISTANT:  None.  DESCRIPTION OF PROCEDURE:  Under adequate monitored anesthesia, the patient was placed in a dorsal lithotomy position.  She was sterilely prepped and draped in usual fashion.  The bladder was catheterized for small amount of urine.  Examination under anesthesia revealed anteverted uterus.  No adnexal masses could be appreciated.  A bivalve speculum was placed in the vagina.  The vagina was then prepped with Betadine solution.  A 20 mL of 1% Nesacaine was injected paracervically at the 3 and 9 o'clock positions.  The single-tooth tenaculum was placed on the anterior lip of the cervix.  The cervix had a pinpoint os.  Gentle dilation under ultrasound guidance was done as the internal os could not be initially found.  The cervix was then serially dilated up to #[facility].  A #7 mm curved suction cannula was introduced into the uterine cavity.  Products of conception was obtained.  The cavity was then curetted but it was felt to be quite thick. Under ultrasound guidance and using the sharp curettage, a large amount of products of conception was removed followed by still irregularity of the uterine Cavity despite currettage.  At that point, request for transvaginal ultrasound was done and performed.  In doing so, it was then  noted that there is still in the midportion of the endometrial cavity,findings of more products of conception particularly on the anterior wall.  The bivalve speculum was then reinserted.  The cervix was then grasped again with a single-tooth tenaculum and using the sharp curette, the tissue was gently removed until the cavity felt to be empty.  At that point, the instruments were removed.  The transvaginal probe was then reinserted and a nice thin stripe was noted at which time, the procedure was terminated by removing all instruments.  SPECIMEN:  Retained products of conception sent to Pathology.  ESTIMATED BLOOD LOSS:  20 mL.  COMPLICATIONS:  None.  The patient tolerated the procedure well, was transferred to recovery in stable condition.     Maxie BetterSheronette Riyanna Crutchley, M.D.     [place]/MEDQ  D:  08/16/2014  T:  08/16/2014  Job:  [id number]

## 2014-08-18 ENCOUNTER — Encounter (HOSPITAL_COMMUNITY): Payer: Self-pay | Admitting: Obstetrics and Gynecology

## 2014-09-15 ENCOUNTER — Other Ambulatory Visit (HOSPITAL_COMMUNITY): Payer: Self-pay | Admitting: Obstetrics and Gynecology

## 2014-09-15 DIAGNOSIS — Z09 Encounter for follow-up examination after completed treatment for conditions other than malignant neoplasm: Secondary | ICD-10-CM

## 2014-09-15 DIAGNOSIS — O034 Incomplete spontaneous abortion without complication: Secondary | ICD-10-CM

## 2014-09-22 ENCOUNTER — Ambulatory Visit (HOSPITAL_COMMUNITY)
Admission: RE | Admit: 2014-09-22 | Discharge: 2014-09-22 | Disposition: A | Discharge status: Home / Self Care | Payer: BC Managed Care – PPO | Source: Ambulatory Visit | Attending: Obstetrics and Gynecology | Admitting: Obstetrics and Gynecology

## 2014-09-22 ENCOUNTER — Encounter (HOSPITAL_COMMUNITY): Payer: Self-pay

## 2014-09-22 DIAGNOSIS — N979 Female infertility, unspecified: Secondary | ICD-10-CM | POA: Insufficient documentation

## 2014-09-22 DIAGNOSIS — Z09 Encounter for follow-up examination after completed treatment for conditions other than malignant neoplasm: Secondary | ICD-10-CM

## 2014-09-22 DIAGNOSIS — O034 Incomplete spontaneous abortion without complication: Secondary | ICD-10-CM

## 2014-09-22 LAB — PREGNANCY, URINE: PREG TEST UR: NEGATIVE

## 2014-09-22 MED ORDER — IOHEXOL 300 MG/ML  SOLN
20.0000 mL | Freq: Once | INTRAMUSCULAR | Status: AC | PRN
  Administered 2014-09-22: 20 mL

## 2014-10-31 LAB — OB RESULTS CONSOLE GBS: STREP GROUP B AG: POSITIVE

## 2015-03-08 ENCOUNTER — Ambulatory Visit: Payer: BLUE CROSS/BLUE SHIELD | Admitting: Obstetrics

## 2015-03-27 ENCOUNTER — Inpatient Hospital Stay (HOSPITAL_COMMUNITY)
Admission: AD | Admit: 2015-03-27 | Discharge: 2015-03-28 | Disposition: A | Discharge status: Home / Self Care | Payer: BC Managed Care – PPO | Source: Ambulatory Visit | Attending: Obstetrics and Gynecology | Admitting: Obstetrics and Gynecology

## 2015-03-27 ENCOUNTER — Encounter (HOSPITAL_COMMUNITY): Payer: Self-pay | Admitting: *Deleted

## 2015-03-27 DIAGNOSIS — Z3201 Encounter for pregnancy test, result positive: Secondary | ICD-10-CM

## 2015-03-27 DIAGNOSIS — M545 Low back pain, unspecified: Secondary | ICD-10-CM

## 2015-03-27 DIAGNOSIS — Z87891 Personal history of nicotine dependence: Secondary | ICD-10-CM | POA: Insufficient documentation

## 2015-03-27 DIAGNOSIS — Z3200 Encounter for pregnancy test, result unknown: Secondary | ICD-10-CM | POA: Diagnosis present

## 2015-03-27 DIAGNOSIS — Z3490 Encounter for supervision of normal pregnancy, unspecified, unspecified trimester: Secondary | ICD-10-CM

## 2015-03-27 LAB — POCT PREGNANCY, URINE: PREG TEST UR: POSITIVE — AB

## 2015-03-27 NOTE — MAU Note (Addendum)
PT SAYS SHE TOOK  UPT  ON  Tuesday  -  NEG  AND  THEN  TOOK ANOTHER  ON SAT -   FAINT  LINE.     ON Tuesday  -  HAD  CRAMPING-  BUT  NO CYCLE.   NO BIRTH CONTROL-  LAST SEX-  WED.     WAS SEEN IN   DR COUSINS  OFFICE IN [place]   FOR  D AND  C.    WENT  BACK  FOR  FOLLOW- UP-  ALL OK.    NO BLEEDING  NOW,    HAD  SPOTTING  ON Tuesday    WHEN  SHE  WIPED.        HAS  BACK PAIN -  THAT  RADIATES    TO LEFT   SIDE.

## 2015-03-28 ENCOUNTER — Telehealth: Payer: Self-pay | Admitting: Obstetrics

## 2015-03-28 ENCOUNTER — Encounter (HOSPITAL_COMMUNITY): Payer: Self-pay | Admitting: *Deleted

## 2015-03-28 DIAGNOSIS — Z3201 Encounter for pregnancy test, result positive: Secondary | ICD-10-CM | POA: Diagnosis not present

## 2015-03-28 LAB — URINALYSIS, ROUTINE W REFLEX MICROSCOPIC
Bilirubin Urine: NEGATIVE
Glucose, UA: NEGATIVE mg/dL
HGB URINE DIPSTICK: NEGATIVE
Ketones, ur: NEGATIVE mg/dL
Nitrite: NEGATIVE
PROTEIN: NEGATIVE mg/dL
SPECIFIC GRAVITY, URINE: 1.01 (ref 1.005–1.030)
UROBILINOGEN UA: 0.2 mg/dL (ref 0.0–1.0)
pH: 6 (ref 5.0–8.0)

## 2015-03-28 LAB — URINE MICROSCOPIC-ADD ON

## 2015-03-28 LAB — HCG, QUANTITATIVE, PREGNANCY: hCG, Beta Chain, Quant, S: 1427 m[IU]/mL — ABNORMAL HIGH (ref <5)

## 2015-03-28 NOTE — Discharge Instructions (Signed)
Back Pain in Pregnancy °Back pain during pregnancy is common. It happens in about half of all pregnancies. It is important for you and your baby that you remain active during your pregnancy. If you feel that back pain is not allowing you to remain active or sleep well, it is time to see your caregiver. Back pain may be caused by several factors related to changes during your pregnancy. Fortunately, unless you had trouble with your back before your pregnancy, the pain is likely to get better after you deliver. °Low back pain usually occurs between the fifth and seventh months of pregnancy. It can, however, happen in the first couple months. Factors that increase the risk of back problems include:  °· Previous back problems. °· Injury to your back. °· Having twins or multiple births. °· A chronic cough. °· Stress. °· Job-related repetitive motions. °· Muscle or spinal disease in the back. °· Family history of back problems, ruptured (herniated) discs, or osteoporosis. °· Depression, anxiety, and panic attacks. °CAUSES  °· When you are pregnant, your body produces a hormone called relaxin. This hormone makes the ligaments connecting the low back and pubic bones more flexible. This flexibility allows the baby to be delivered more easily. When your ligaments are loose, your muscles need to work harder to support your back. Soreness in your back can come from tired muscles. Soreness can also come from back tissues that are irritated since they are receiving less support. °· As the baby grows, it puts pressure on the nerves and blood vessels in your pelvis. This can cause back pain. °· As the baby grows and gets heavier during pregnancy, the uterus pushes the stomach muscles forward and changes your center of gravity. This makes your back muscles work harder to maintain good posture. °SYMPTOMS  °Lumbar pain during pregnancy °Lumbar pain during pregnancy usually occurs at or above the waist in the center of the back. There  may be pain and numbness that radiates into your leg or foot. This is similar to low back pain experienced by non-pregnant women. It usually increases with sitting for long periods of time, standing, or repetitive lifting. Tenderness may also be present in the muscles along your upper back. °Posterior pelvic pain during pregnancy °Pain in the back of the pelvis is more common than lumbar pain in pregnancy. It is a deep pain felt in your side at the waistline, or across the tailbone (sacrum), or in both places. You may have pain on one or both sides. This pain can also go into the buttocks and backs of the upper thighs. Pubic and groin pain may also be present. The pain does not quickly resolve with rest, and morning stiffness may also be present. °Pelvic pain during pregnancy can be brought on by most activities. A high level of fitness before and during pregnancy may or may not prevent this problem. Labor pain is usually 1 to 2 minutes apart, lasts for about 1 minute, and involves a bearing down feeling or pressure in your pelvis. However, if you are at term with the pregnancy, constant low back pain can be the beginning of early labor, and you should be aware of this. °DIAGNOSIS  °X-rays of the back should not be done during the first 12 to 14 weeks of the pregnancy and only when absolutely necessary during the rest of the pregnancy. MRIs do not give off radiation and are safe during pregnancy. MRIs also should only be done when absolutely necessary. °HOME CARE INSTRUCTIONS °· Exercise   as directed by your caregiver. Exercise is the most effective way to prevent or manage back pain. If you have a back problem, it is especially important to avoid sports that require sudden body movements. Swimming and walking are great activities. °· Do not stand in one place for long periods of time. °· Do not wear high heels. °· Sit in chairs with good posture. Use a pillow on your lower back if necessary. Make sure your head  rests over your shoulders and is not hanging forward. °· Try sleeping on your side, preferably the left side, with a pillow or two between your legs. If you are sore after a night's rest, your bed may be too soft. Try placing a board between your mattress and box spring. °· Listen to your body when lifting. If you are experiencing pain, ask for help or try bending your knees more so you can use your leg muscles rather than your back muscles. Squat down when picking up something from the floor. Do not bend over. °· Eat a healthy diet. Try to gain weight within your caregiver's recommendations. °· Use heat or cold packs 3 to 4 times a day for 15 minutes to help with the pain. °· Only take over-the-counter or prescription medicines for pain, discomfort, or fever as directed by your caregiver. °Sudden (acute) back pain °· Use bed rest for only the most extreme, acute episodes of back pain. Prolonged bed rest over 48 hours will aggravate your condition. °· Ice is very effective for acute conditions. °¨ Put ice in a plastic bag. °¨ Place a towel between your skin and the bag. °¨ Leave the ice on for 10 to 20 minutes every 2 hours, or as needed. °· Using heat packs for 30 minutes prior to activities is also helpful. °Continued back pain °See your caregiver if you have continued problems. Your caregiver can help or refer you for appropriate physical therapy. With conditioning, most back problems can be avoided. Sometimes, a more serious issue may be the cause of back pain. You should be seen right away if new problems seem to be developing. Your caregiver may recommend: °· A maternity girdle. °· An elastic sling. °· A back brace. °· A massage therapist or acupuncture. °SEEK MEDICAL CARE IF:  °· You are not able to do most of your daily activities, even when taking the pain medicine you were given. °· You need a referral to a physical therapist or chiropractor. °· You want to try acupuncture. °SEEK IMMEDIATE MEDICAL CARE  IF: °· You develop numbness, tingling, weakness, or problems with the use of your arms or legs. °· You develop severe back pain that is no longer relieved with medicines. °· You have a sudden change in bowel or bladder control. °· You have increasing pain in other areas of the body. °· You develop shortness of breath, dizziness, or fainting. °· You develop nausea, vomiting, or sweating. °· You have back pain which is similar to labor pains. °· You have back pain along with your water breaking or vaginal bleeding. °· You have back pain or numbness that travels down your leg. °· Your back pain developed after you fell. °· You develop pain on one side of your back. You may have a kidney stone. °· You see blood in your urine. You may have a bladder infection or kidney stone. °· You have back pain with blisters. You may have shingles. °Back pain is fairly common during pregnancy but should not be accepted as just part of   the process. Back pain should always be treated as soon as possible. This will make your pregnancy as pleasant as possible.   This information is not intended to replace advice given to you by your health care provider. Make sure you discuss any questions you have with your health care provider.   Document Released: 08/20/2005 Document Revised: 08/04/2011 Document Reviewed: 10/01/2010 Elsevier Interactive Patient Education 2016 [employer] Prenatal Care WHAT IS PRENATAL CARE?  Prenatal care is the process of caring for a pregnant woman before she gives birth. Prenatal care makes sure that she and her baby remain as healthy as possible throughout pregnancy. Prenatal care may be provided by a midwife, family practice health care provider, or a childbirth and pregnancy specialist (obstetrician). Prenatal care may include physical examinations, testing, treatments, and education on nutrition, lifestyle, and social support services. WHY IS PRENATAL CARE SO IMPORTANT?  Early and consistent  prenatal care increases the chance that you and your baby will remain healthy throughout your pregnancy. This type of care also decreases a baby's risk of being born too early (prematurely), or being born smaller than expected (small for gestational age). Any underlying medical conditions you may have that could pose a risk during your pregnancy are discussed during prenatal care visits. You will also be monitored regularly for any new conditions that may arise during your pregnancy so they can be treated quickly and effectively. WHAT HAPPENS DURING PRENATAL CARE VISITS? Prenatal care visits may include the following: Discussion Tell your health care provider about any new signs or symptoms you have experienced since your last visit. These might include:  Nausea or vomiting.  Increased or decreased level of energy.  Difficulty sleeping.  Back or leg pain.  Weight changes.  Frequent urination.  Shortness of breath with physical activity.  Changes in your skin, such as the development of a rash or itchiness.  Vaginal discharge or bleeding.  Feelings of excitement or nervousness.  Changes in your baby's movements. You may want to write down any questions or topics you want to discuss with your health care provider and bring them with you to your appointment. Examination During your first prenatal care visit, you will likely have a complete physical exam. Your health care provider will often examine your vagina, cervix, and the position of your uterus, as well as check your heart, lungs, and other body systems. As your pregnancy progresses, your health care provider will measure the size of your uterus and your baby's position inside your uterus. He or she may also examine you for early signs of labor. Your prenatal visits may also include checking your blood pressure and, after about 10-12 weeks of pregnancy, listening to your baby's heartbeat. Testing Regular testing often  includes:  Urinalysis. This checks your urine for glucose, protein, or signs of infection.  Blood count. This checks the levels of white and red blood cells in your body.  Tests for sexually transmitted infections (STIs). Testing for STIs at the beginning of pregnancy is routinely done and is required in many states.  Antibody testing. You will be checked to see if you are immune to certain illnesses, such as rubella, that can affect a developing fetus.  Glucose screen. Around 24-28 weeks of pregnancy, your blood glucose level will be checked for signs of gestational diabetes. Follow-up tests may be recommended.  Group B strep. This is a bacteria that is commonly found inside a woman's vagina. This test will inform your health care provider if you  need an antibiotic to reduce the amount of this bacteria in your body prior to labor and childbirth.  Ultrasound. Many pregnant women undergo an ultrasound screening around 18-20 weeks of pregnancy to evaluate the health of the fetus and check for any developmental abnormalities.  HIV (human immunodeficiency virus) testing. Early in your pregnancy, you will be screened for HIV. If you are at high risk for HIV, this test may be repeated during your third trimester of pregnancy. You may be offered other testing based on your age, personal or family medical history, or other factors.  HOW OFTEN SHOULD I PLAN TO SEE MY HEALTH CARE PROVIDER FOR PRENATAL CARE? Your prenatal care check-up schedule depends on any medical conditions you have before, or develop during, your pregnancy. If you do not have any underlying medical conditions, you will likely be seen for checkups:  Monthly, during the first 6 months of pregnancy.  Twice a month during months 7 and 8 of pregnancy.  Weekly starting in the 9th month of pregnancy and until delivery. If you develop signs of early labor or other concerning signs or symptoms, you may need to see your health care  provider more often. Ask your health care provider what prenatal care schedule is best for you. WHAT CAN I DO TO KEEP MYSELF AND MY BABY AS HEALTHY AS POSSIBLE DURING MY PREGNANCY?  Take a prenatal vitamin containing 400 micrograms (0.4 mg) of folic acid every day. Your health care provider may also ask you to take additional vitamins such as iodine, vitamin D, iron, copper, and zinc.  Take 1500-2000 mg of calcium daily starting at your 20th week of pregnancy until you deliver your baby.  Make sure you are up to date on your vaccinations. Unless directed otherwise by your health care provider:  You should receive a tetanus, diphtheria, and pertussis (Tdap) vaccination between the 27th and 36th week of your pregnancy, regardless of when your last Tdap immunization occurred. This helps protect your baby from whooping cough (pertussis) after he or she is born.  You should receive an annual inactivated influenza vaccine (IIV) to help protect you and your baby from influenza. This can be done at any point during your pregnancy.  Eat a well-rounded diet that includes:  Fresh fruits and vegetables.  Lean proteins.  Calcium-rich foods such as milk, yogurt, hard cheeses, and dark, leafy greens.  Whole grain breads.  Do noteat seafood high in mercury, including:  Swordfish.  Tilefish.  Shark.  King mackerel.  More than 6 oz tuna per week.  Do not eat:  Raw or undercooked meats or eggs.  Unpasteurized foods, such as soft cheeses (brie, blue, or feta), juices, and milks.  Lunch meats.  Hot dogs that have not been heated until they are steaming.  Drink enough water to keep your urine clear or pale yellow. For many women, this may be 10 or more 8 oz glasses of water each day. Keeping yourself hydrated helps deliver nutrients to your baby and may prevent the start of pre-term uterine contractions.  Do not use any tobacco products including cigarettes, chewing tobacco, or electronic  cigarettes. If you need help quitting, ask your health care provider.  Do not drink beverages containing alcohol. No safe level of alcohol consumption during pregnancy has been determined.  Do not use any illegal drugs. These can harm your developing baby or cause a miscarriage.  Ask your health care provider or pharmacist before taking any prescription or over-the-counter medicines, herbs, or  supplements.  Limit your caffeine intake to no more than 200 mg per day.  Exercise. Unless told otherwise by your health care provider, try to get 30 minutes of moderate exercise most days of the week. Do not  do high-impact activities, contact sports, or activities with a high risk of falling, such as horseback riding or downhill skiing.  Get plenty of rest.  Avoid anything that raises your body temperature, such as hot tubs and saunas.  If you own a cat, do not empty its litter box. Bacteria contained in cat feces can cause an infection called toxoplasmosis. This can result in serious harm to the fetus.  Stay away from chemicals such as insecticides, lead, mercury, and cleaning or paint products that contain solvents.  Do not have any X-rays taken unless medically necessary.  Take a childbirth and breastfeeding preparation class. Ask your health care provider if you need a referral or recommendation.   This information is not intended to replace advice given to you by your health care provider. Make sure you discuss any questions you have with your health care provider.   Document Released: 05/15/2003 Document Revised: 06/02/2014 Document Reviewed: 07/27/2013 Elsevier Interactive Patient Education [employer]

## 2015-03-28 NOTE — Telephone Encounter (Signed)
03/28/2015 - Patient returned call and advised her of Dr. Verdell CarmineHarper's decision for her to return to Dr. Cherly Hensenousins. brm

## 2015-03-28 NOTE — MAU Provider Note (Signed)
History     CSN: [id number]  Arrival date and time: 03/27/15 2242   None     No chief complaint on file.  HPI Comments: [id number] @[redacted]w[redacted]d  by LMP here for pregnancy confirmation and lower back pain x2 wks. No dysuria, polyuria, or fever. No vaginal bleeding or discharge. No cramping or abdominal pain. Had +HPT 3 days ago.   OB History    Gravida Para Term Preterm AB TAB SAB Ectopic Multiple Living   3 0 0 0 2 0 2 0 0 0       Past Medical History  Diagnosis Date  . Insomnia   . Depression     age 24yo, resolved  . Wears glasses   . Routine gynecological examination     Dr. Jean RosenthalJackson  . Eczema   . Chlamydia contact, treated 02-2014    Past Surgical History  Procedure Laterality Date  . Wisdom tooth extraction    . Dilation and evacuation N/A 06/29/2014    Procedure: DILATATION AND EVACUATION;  Surgeon: Brock Badharles A Harper, MD;  Location: [facility];  Service: Gynecology;  Laterality: N/A;  . Dilation and evacuation N/A 08/16/2014    Procedure: DILATATION AND EVACUATION;  Surgeon: Maxie BetterSheronette Cousins, MD;  Location: [facility];  Service: Gynecology;  Laterality: N/A;  . Operative ultrasound N/A 08/16/2014    Procedure: OPERATIVE ULTRASOUND;  Surgeon: Maxie BetterSheronette Cousins, MD;  Location: [facility];  Service: Gynecology;  Laterality: N/A;    History reviewed. No pertinent family history.  Social History  Substance Use Topics  . Smoking status: Former Smoker    Types: Cigars  . Smokeless tobacco: None  . Alcohol Use: No     Comment: social    Allergies:  Allergies  Allergen Reactions  . Nickel Rash    Prescriptions prior to admission  Medication Sig Dispense Refill Last Dose  . Prenatal Vit-Fe Fumarate-FA (PRENATAL MULTIVITAMIN) TABS tablet Take 1 tablet by mouth daily at 12 noon.   Past Month at Unknown time  . doxycycline (VIBRA-TABS) 100 MG tablet Take 1 tablet (100 mg total) by mouth 2 (two) times daily. 10 tablet 0   . ibuprofen (ADVIL,MOTRIN) 600 MG tablet Take 1 tablet (600 mg  total) by mouth every 6 (six) hours as needed. (Patient not taking: Reported on 07/17/2014) 30 tablet 0 Not Taking at Unknown time  . oxyCODONE-acetaminophen (PERCOCET/ROXICET) 5-325 MG per tablet Take 1-2 tablets by mouth every 6 (six) hours as needed for severe pain. (Patient not taking: Reported on 08/10/2014) 15 tablet 0 Not Taking at Unknown time    Review of Systems  Constitutional: Negative.   HENT: Negative.   Eyes: Negative.   Respiratory: Negative.   Cardiovascular: Negative.   Gastrointestinal: Negative.   Genitourinary: Negative.   Musculoskeletal: Positive for back pain.  Skin: Negative.   Neurological: Negative.   Endo/Heme/Allergies: Negative.   Psychiatric/Behavioral: Negative.    Physical Exam   Blood pressure 115/54, pulse 90, temperature 98.3 F (36.8 C), temperature source Oral, resp. rate 20, height 5' (1.524 m), weight 61.236 kg (135 lb), last menstrual period 02/24/2015, unknown if currently breastfeeding.  Physical Exam  Constitutional: She is oriented to person, place, and time. She appears well-developed and well-nourished.  HENT:  Head: Normocephalic.  Neck: Normal range of motion. Neck supple.  Cardiovascular: Normal rate and regular rhythm.   CVA neg  Respiratory: Effort normal and breath sounds normal.  GI: Soft. Bowel sounds are normal. She exhibits no distension. There is no tenderness.  Genitourinary:  deferred  Musculoskeletal: Normal range of motion.  Neurological: She is alert and oriented to person, place, and time.  Skin: Skin is warm and dry.  Psychiatric: She has a normal mood and affect.   Results for CALEAH, TORTORELLI (MRN [id number]) as of 03/29/2015 08:15  Ref. Range 03/27/2015 22:59 03/27/2015 23:09 03/27/2015 23:20  HCG, Beta Chain, Quant, S Latest Ref Range: <5 mIU/mL   1427 (H)  Preg Test, Ur Latest Ref Range: NEGATIVE   POSITIVE (A)   Appearance Latest Ref Range: CLEAR  CLEAR    Bacteria, UA Latest Ref Range: RARE  FEW (A)     Bilirubin Urine Latest Ref Range: NEGATIVE  NEGATIVE    Color, Urine Latest Ref Range: YELLOW  YELLOW    Glucose Latest Ref Range: NEGATIVE mg/dL NEGATIVE    Hgb urine dipstick Latest Ref Range: NEGATIVE  NEGATIVE    Ketones, ur Latest Ref Range: NEGATIVE mg/dL NEGATIVE    Leukocytes, UA Latest Ref Range: NEGATIVE  SMALL (A)    Nitrite Latest Ref Range: NEGATIVE  NEGATIVE    pH Latest Ref Range: 5.0-8.0  6.0    Protein Latest Ref Range: NEGATIVE mg/dL NEGATIVE    RBC / HPF Latest Ref Range: <3 RBC/hpf 3-6    Specific Gravity, Urine Latest Ref Range: 1.005-1.030  1.010    Squamous Epithelial / LPF Latest Ref Range: RARE  RARE    Urine-Other Unknown TRICHOMONAS PRESENT    Urobilinogen, UA Latest Ref Range: 0.0-1.0 mg/dL 0.2    WBC, UA Latest Ref Range: <3 WBC/hpf 3-6     MAU Course  Procedures UA UPT Quant HCG Assessment and Plan  Confirmation of pregnancy Early pregnancy Lower back pain-likely musculoskeletal  Discharge home Increase water intake- needs 6 bottles per day Start PNV 1 po daily- may buy OTC Planning [facility] for f/u appt. and rpt quant in 2 days  Donato Studley, N 03/28/2015, 12:25 AM

## 2015-04-27 LAB — OB RESULTS CONSOLE RPR: RPR: NONREACTIVE

## 2015-04-27 LAB — OB RESULTS CONSOLE ABO/RH: RH Type: POSITIVE

## 2015-04-27 LAB — OB RESULTS CONSOLE RUBELLA ANTIBODY, IGM: Rubella: IMMUNE

## 2015-04-27 LAB — OB RESULTS CONSOLE ANTIBODY SCREEN: ANTIBODY SCREEN: NEGATIVE

## 2015-04-27 LAB — OB RESULTS CONSOLE HEPATITIS B SURFACE ANTIGEN: Hepatitis B Surface Ag: NEGATIVE

## 2015-04-27 LAB — OB RESULTS CONSOLE HIV ANTIBODY (ROUTINE TESTING): HIV: NONREACTIVE

## 2015-04-30 ENCOUNTER — Other Ambulatory Visit: Payer: Self-pay | Admitting: Certified Nurse Midwife

## 2015-05-23 ENCOUNTER — Inpatient Hospital Stay (HOSPITAL_COMMUNITY): Payer: BC Managed Care – PPO

## 2015-05-23 ENCOUNTER — Inpatient Hospital Stay (HOSPITAL_COMMUNITY)
Admission: AD | Admit: 2015-05-23 | Discharge: 2015-05-23 | Disposition: A | Discharge status: Home / Self Care | Payer: BC Managed Care – PPO | Source: Ambulatory Visit | Attending: Obstetrics and Gynecology | Admitting: Obstetrics and Gynecology

## 2015-05-23 ENCOUNTER — Encounter (HOSPITAL_COMMUNITY): Payer: Self-pay

## 2015-05-23 DIAGNOSIS — R109 Unspecified abdominal pain: Secondary | ICD-10-CM

## 2015-05-23 DIAGNOSIS — O26891 Other specified pregnancy related conditions, first trimester: Secondary | ICD-10-CM | POA: Insufficient documentation

## 2015-05-23 DIAGNOSIS — Z3A12 12 weeks gestation of pregnancy: Secondary | ICD-10-CM | POA: Insufficient documentation

## 2015-05-23 DIAGNOSIS — Z043 Encounter for examination and observation following other accident: Secondary | ICD-10-CM | POA: Diagnosis present

## 2015-05-23 NOTE — Discharge Instructions (Signed)

## 2015-05-23 NOTE — MAU Provider Note (Signed)
Medical Screening:  S: 24 y.o. G3P0020 with hx 2 previous SABs presents @ 6839w4d today after slip and fall this morning.  She reports she lost her balance, caught herself with a chair, then fell backwards into a wall at her house this morning.  She denies pain in her back where she hit the wall. She denies abdominal pain or vaginal bleeding.  She has not required any comfort measures or pain medication.  She denies LOF, vaginal itching/burning, urinary symptoms, h/a, dizziness, n/v, or fever/chills.    O: BP 126/75 mmHg  Pulse 107  Temp(Src) 98.5 F (36.9 C) (Oral)  LMP 02/24/2015   VS reviewed, nursing note reviewed,  Constitutional: well developed, well nourished, no distress HEENT: normocephalic CV: normal rate Pulm/chest wall: normal effort Abdomen: soft Neuro: alert and oriented x 3 Skin: warm, dry Psych: affect normal  MDM: I have performed a medical screening on this pt.  Reviewed normal US findings with pt and reassurance provided.  No evidence of injury after pt fall.  Notified Dr Su Hiltoberts, who is in OR at this time.  Pt stable prior to discharge.  A: G3P0020 @ 577w6d Traumatic injury in pregnancy Fall  P: [facility] to follow up in MAU with pt Pt has appt in office 05/27/14   Christine Ross, Christine Ross, CNM 10:55 AM   MAU Addendum Note  US unremarkable, no abruption noted   Plan: -Discussed need to follow up in office Tuesday May 29, 2015 -Bleeding and PTL Precautions -Encouraged to call if any questions or concerns arise prior to next scheduled office visit.  -Discharged to home in stable condition    Christine Ross, CNM, MSN 05/23/2015. 11:20 AM

## 2015-05-23 NOTE — MAU Note (Signed)
Patient presents s/p fall this morning, tripped landed hitting her back.

## 2015-05-27 NOTE — L&D Delivery Note (Addendum)
Delivery Note At 5:42 AM a viable female, "Christine Ross", was delivered via Vaginal, Spontaneous Delivery (Presentation: Left Occiput Anterior).  APGAR: 8, 9; weight  .   Placenta status: Intact, Spontaneous.  Cord: 3 vessels with the following complications: Loose CAN x 1, reduced over vtx at delivery.  Cord pH: NA  Anesthesia: Epidural  Episiotomy: None Lacerations: 1st degree;Perineal; bilateral periurethral lacerations--repaired for hemostasis. Suture Repair: 3.0 vicryl Est. Blood Loss (mL):  100 cc  Mom to postpartum.  Baby to Couplet care / Skin to Skin. Family plans outpatient circumcision. Patient undecided regarding contraception.  Nigel BridgemanLATHAM, Jahseh Lucchese 11/25/2015, 6:12 AM

## 2015-11-08 ENCOUNTER — Encounter (HOSPITAL_COMMUNITY): Payer: Self-pay

## 2015-11-08 ENCOUNTER — Inpatient Hospital Stay (HOSPITAL_COMMUNITY)
Admission: AD | Admit: 2015-11-08 | Discharge: 2015-11-08 | Disposition: A | Discharge status: Home / Self Care | Payer: BC Managed Care – PPO | Source: Ambulatory Visit | Attending: Obstetrics & Gynecology | Admitting: Obstetrics & Gynecology

## 2015-11-08 DIAGNOSIS — Z87891 Personal history of nicotine dependence: Secondary | ICD-10-CM | POA: Insufficient documentation

## 2015-11-08 DIAGNOSIS — O212 Late vomiting of pregnancy: Secondary | ICD-10-CM | POA: Diagnosis not present

## 2015-11-08 DIAGNOSIS — O219 Vomiting of pregnancy, unspecified: Secondary | ICD-10-CM | POA: Diagnosis not present

## 2015-11-08 DIAGNOSIS — R05 Cough: Secondary | ICD-10-CM

## 2015-11-08 DIAGNOSIS — R059 Cough, unspecified: Secondary | ICD-10-CM

## 2015-11-08 DIAGNOSIS — R197 Diarrhea, unspecified: Secondary | ICD-10-CM | POA: Insufficient documentation

## 2015-11-08 DIAGNOSIS — Z3A36 36 weeks gestation of pregnancy: Secondary | ICD-10-CM | POA: Insufficient documentation

## 2015-11-08 LAB — URINALYSIS, ROUTINE W REFLEX MICROSCOPIC
Bilirubin Urine: NEGATIVE
GLUCOSE, UA: NEGATIVE mg/dL
Hgb urine dipstick: NEGATIVE
Ketones, ur: 15 mg/dL — AB
Nitrite: NEGATIVE
PH: 6 (ref 5.0–8.0)
Protein, ur: NEGATIVE mg/dL
SPECIFIC GRAVITY, URINE: 1.02 (ref 1.005–1.030)

## 2015-11-08 LAB — URINE MICROSCOPIC-ADD ON

## 2015-11-08 MED ORDER — ONDANSETRON HCL 4 MG PO TABS: 4.0000 mg | ORAL_TABLET | Freq: Four times a day (QID) | ORAL | Status: DC

## 2015-11-08 MED ORDER — ONDANSETRON HCL 4 MG PO TABS
4.0000 mg | ORAL_TABLET | Freq: Once | ORAL | Status: AC
  Administered 2015-11-08: 4 mg via ORAL
  Filled 2015-11-08: qty 1

## 2015-11-08 NOTE — MAU Note (Signed)
Pt states she has been vomiting all morning, also diarrhea since last week.  2-3 diarrhea stools this a.m.  Also C/O sore throat & congestion.  Denies abd pain, bleeding or LOF.

## 2015-11-08 NOTE — MAU Provider Note (Signed)
History     CSN: [id number]  Arrival date and time: 11/08/15 1306   None     Chief Complaint  Patient presents with  . Emesis During Pregnancy  . Diarrhea   HPI  Pt is is [redacted]w[redacted]d pregnat G3P0020 who presents with vomiting in pregnancy that started last night with diarrhea that started last week- pt has had diarrhea fist thing in the morning back to back- now 2 to 3 times daily- Pt had waffles she had yesterday morning and spaghetti yesterday mid day. Pt ate peaches this morning but threw up.   Pt had a ctx 2 days ago; has pelvic discomfort more at night. Pt denies fever or chills Pt has cough that started 2 days ago with clear sputum. Pt has sore throat from cough.  Pt has not been taking anything for the cough Pt has appointment at [facility] tomorrow   Past Medical History  Diagnosis Date  . Insomnia   . Depression     age 25yo, resolved  . Wears glasses   . Routine gynecological examination     Dr. Jean Rosenthal  . Eczema   . Chlamydia contact, treated 02-2014    Past Surgical History  Procedure Laterality Date  . Wisdom tooth extraction    . Dilation and evacuation N/A 06/29/2014    Procedure: DILATATION AND EVACUATION;  Surgeon: Brock Bad, MD;  Location: [facility];  Service: Gynecology;  Laterality: N/A;  . Dilation and evacuation N/A 08/16/2014    Procedure: DILATATION AND EVACUATION;  Surgeon: Maxie Better, MD;  Location: [facility];  Service: Gynecology;  Laterality: N/A;  . Operative ultrasound N/A 08/16/2014    Procedure: OPERATIVE ULTRASOUND;  Surgeon: Maxie Better, MD;  Location: [facility];  Service: Gynecology;  Laterality: N/A;    Family History  Problem Relation Age of Onset  . Diabetes Mother   . Diabetes Maternal Grandmother   . Diabetes Paternal Grandmother     Social History  Substance Use Topics  . Smoking status: Former Smoker    Types: Cigars  . Smokeless tobacco: Never Used  . Alcohol Use: No     Comment: social    Allergies:  Allergies   Allergen Reactions  . Nickel Rash    Prescriptions prior to admission  Medication Sig Dispense Refill Last Dose  . acetaminophen (TYLENOL) 325 MG tablet Take 650 mg by mouth every 6 (six) hours as needed.   Past Month at Unknown time    ROS Physical Exam   Blood pressure 107/69, pulse 88, temperature 98.2 F (36.8 C), temperature source Oral, resp. rate 18, height 5' (1.524 m), weight 158 lb (71.668 kg), last menstrual period 02/24/2015, unknown if currently breastfeeding.  Physical Exam  Nursing note and vitals reviewed. Constitutional: She is oriented to person, place, and time. She appears well-developed and well-nourished. No distress.  HENT:  Head: Normocephalic.  Mouth/Throat: Oropharynx is clear and moist.  Eyes: Pupils are equal, round, and reactive to light.  Neck: Normal range of motion. Neck supple.  Cardiovascular: Normal rate and regular rhythm.   Respiratory: Effort normal and breath sounds normal. No respiratory distress. She has no wheezes. She has no rales.  GI: Soft. She exhibits no distension. There is no tenderness. There is no rebound.  Musculoskeletal: Normal range of motion.  Neurological: She is alert and oriented to person, place, and time.  Skin: Skin is warm and dry.  Psychiatric: She has a normal mood and affect.    MAU Course  Procedures  Results for orders placed or performed during the hospital encounter of 11/08/15 (from the past 24 hour(s))  Urinalysis, Routine w reflex microscopic (not [facility])     Status: Abnormal   Collection Time: 11/08/15  1:20 PM  Result Value Ref Range   Color, Urine YELLOW YELLOW   APPearance HAZY (A) CLEAR   Specific Gravity, Urine 1.020 1.005 - 1.030   pH 6.0 5.0 - 8.0   Glucose, UA NEGATIVE NEGATIVE mg/dL   Hgb urine dipstick NEGATIVE NEGATIVE   Bilirubin Urine NEGATIVE NEGATIVE   Ketones, ur 15 (A) NEGATIVE mg/dL   Protein, ur NEGATIVE NEGATIVE mg/dL   Nitrite NEGATIVE NEGATIVE   Leukocytes, UA TRACE (A)  NEGATIVE  Urine microscopic-add on     Status: Abnormal   Collection Time: 11/08/15  1:20 PM  Result Value Ref Range   Squamous Epithelial / LPF 6-30 (A) NONE SEEN   WBC, UA 6-30 0 - 5 WBC/hpf   RBC / HPF 0-5 0 - 5 RBC/hpf   Bacteria, UA MANY (A) NONE SEEN  Zofran 4 mg given for nausea- pt tolerated crackers and PO fluids No vomiting or diarrhea while in MAU NST reactive; FHR 130 bpm, 6-25 bpm variability; 15x15 accelerations no dec; some mild UI Discussed with Dr. Sallye OberKulwa-  Assessment and Plan  Vomiting and diarrhea in pregnancy Rx zofran 4mg  tablet BRAT diet; increase in fluids Viable IUP 1240w5d with reactive NST Keep OB appointment scheduled tomorrow Velena Keegan 11/08/2015, 2:02 PM

## 2015-11-08 NOTE — Discharge Instructions (Signed)

## 2015-11-24 ENCOUNTER — Encounter (HOSPITAL_COMMUNITY): Payer: Self-pay | Admitting: *Deleted

## 2015-11-24 ENCOUNTER — Inpatient Hospital Stay (HOSPITAL_COMMUNITY): Payer: BC Managed Care – PPO | Admitting: Anesthesiology

## 2015-11-24 ENCOUNTER — Inpatient Hospital Stay (HOSPITAL_COMMUNITY)
Admission: AD | Admit: 2015-11-24 | Discharge: 2015-11-27 | DRG: 775 | Disposition: A | Discharge status: Home / Self Care | Payer: BC Managed Care – PPO | Source: Ambulatory Visit | Attending: Obstetrics and Gynecology | Admitting: Obstetrics and Gynecology

## 2015-11-24 DIAGNOSIS — Z3A39 39 weeks gestation of pregnancy: Secondary | ICD-10-CM | POA: Diagnosis not present

## 2015-11-24 DIAGNOSIS — Z9889 Other specified postprocedural states: Secondary | ICD-10-CM

## 2015-11-24 DIAGNOSIS — Z833 Family history of diabetes mellitus: Secondary | ICD-10-CM

## 2015-11-24 DIAGNOSIS — Z87891 Personal history of nicotine dependence: Secondary | ICD-10-CM

## 2015-11-24 DIAGNOSIS — O4202 Full-term premature rupture of membranes, onset of labor within 24 hours of rupture: Secondary | ICD-10-CM | POA: Diagnosis present

## 2015-11-24 DIAGNOSIS — O99824 Streptococcus B carrier state complicating childbirth: Secondary | ICD-10-CM | POA: Diagnosis present

## 2015-11-24 DIAGNOSIS — O9902 Anemia complicating childbirth: Secondary | ICD-10-CM | POA: Diagnosis present

## 2015-11-24 HISTORY — DX: Other chlamydial infection of lower genitourinary tract: A56.09

## 2015-11-24 HISTORY — DX: Gonococcal infection, unspecified: A54.9

## 2015-11-24 LAB — CBC
HEMATOCRIT: 28.9 % — AB (ref 36.0–46.0)
Hemoglobin: 9.7 g/dL — ABNORMAL LOW (ref 12.0–15.0)
MCH: 27.4 pg (ref 26.0–34.0)
MCHC: 33.6 g/dL (ref 30.0–36.0)
MCV: 81.6 fL (ref 78.0–100.0)
PLATELETS: 203 10*3/uL (ref 150–400)
RBC: 3.54 MIL/uL — AB (ref 3.87–5.11)
RDW: 14.3 % (ref 11.5–15.5)
WBC: 7.3 10*3/uL (ref 4.0–10.5)

## 2015-11-24 LAB — OB RESULTS CONSOLE GBS: STREP GROUP B AG: POSITIVE

## 2015-11-24 LAB — TYPE AND SCREEN
ABO/RH(D): O POS
Antibody Screen: NEGATIVE

## 2015-11-24 LAB — POCT FERN TEST: POCT Fern Test: POSITIVE

## 2015-11-24 MED ORDER — OXYTOCIN 40 UNITS IN LACTATED RINGERS INFUSION - SIMPLE MED
1.0000 m[IU]/min | INTRAVENOUS | Status: DC
  Administered 2015-11-24: 1 m[IU]/min via INTRAVENOUS
  Filled 2015-11-24: qty 1000

## 2015-11-24 MED ORDER — PHENYLEPHRINE 40 MCG/ML (10ML) SYRINGE FOR IV PUSH (FOR BLOOD PRESSURE SUPPORT)
80.0000 ug | PREFILLED_SYRINGE | INTRAVENOUS | Status: DC | PRN
  Filled 2015-11-24: qty 5
  Filled 2015-11-24: qty 10

## 2015-11-24 MED ORDER — EPHEDRINE 5 MG/ML INJ
10.0000 mg | INTRAVENOUS | Status: DC | PRN
  Filled 2015-11-24: qty 2

## 2015-11-24 MED ORDER — TERBUTALINE SULFATE 1 MG/ML IJ SOLN
0.2500 mg | Freq: Once | INTRAMUSCULAR | Status: DC | PRN
  Filled 2015-11-24: qty 1

## 2015-11-24 MED ORDER — LACTATED RINGERS IV SOLN
500.0000 mL | Freq: Once | INTRAVENOUS | Status: AC
  Administered 2015-11-24: 500 mL via INTRAVENOUS

## 2015-11-24 MED ORDER — OXYCODONE-ACETAMINOPHEN 5-325 MG PO TABS: 2.0000 | ORAL_TABLET | ORAL | Status: DC | PRN

## 2015-11-24 MED ORDER — SOD CITRATE-CITRIC ACID 500-334 MG/5ML PO SOLN: 30.0000 mL | ORAL | Status: DC | PRN

## 2015-11-24 MED ORDER — ACETAMINOPHEN 325 MG PO TABS: 650.0000 mg | ORAL_TABLET | ORAL | Status: DC | PRN

## 2015-11-24 MED ORDER — MISOPROSTOL 200 MCG PO TABS: 50.0000 ug | ORAL_TABLET | ORAL | Status: DC | PRN

## 2015-11-24 MED ORDER — OXYCODONE-ACETAMINOPHEN 5-325 MG PO TABS: 1.0000 | ORAL_TABLET | ORAL | Status: DC | PRN

## 2015-11-24 MED ORDER — DIPHENHYDRAMINE HCL 50 MG/ML IJ SOLN: 12.5000 mg | INTRAMUSCULAR | Status: DC | PRN

## 2015-11-24 MED ORDER — OXYTOCIN 40 UNITS IN LACTATED RINGERS INFUSION - SIMPLE MED
2.5000 [IU]/h | INTRAVENOUS | Status: DC
  Administered 2015-11-25: 2.5 [IU]/h via INTRAVENOUS

## 2015-11-24 MED ORDER — PHENYLEPHRINE 40 MCG/ML (10ML) SYRINGE FOR IV PUSH (FOR BLOOD PRESSURE SUPPORT)
80.0000 ug | PREFILLED_SYRINGE | INTRAVENOUS | Status: DC | PRN
  Filled 2015-11-24: qty 5

## 2015-11-24 MED ORDER — LACTATED RINGERS IV SOLN
INTRAVENOUS | Status: DC
  Administered 2015-11-24 – 2015-11-25 (×2): via INTRAVENOUS

## 2015-11-24 MED ORDER — LIDOCAINE HCL (PF) 1 % IJ SOLN
INTRAMUSCULAR | Status: DC | PRN
  Administered 2015-11-24: 3 mL
  Administered 2015-11-24: 5 mL
  Administered 2015-11-24: 2 mL

## 2015-11-24 MED ORDER — PENICILLIN G POTASSIUM 5000000 UNITS IJ SOLR
5.0000 10*6.[IU] | Freq: Once | INTRAVENOUS | Status: AC
  Administered 2015-11-24: 5 10*6.[IU] via INTRAVENOUS
  Filled 2015-11-24: qty 5

## 2015-11-24 MED ORDER — FENTANYL 2.5 MCG/ML BUPIVACAINE 1/10 % EPIDURAL INFUSION (WH - ANES)
14.0000 mL/h | INTRAMUSCULAR | Status: DC | PRN
  Administered 2015-11-24 (×2): 14 mL/h via EPIDURAL
  Filled 2015-11-24: qty 125

## 2015-11-24 MED ORDER — LACTATED RINGERS IV SOLN: 500.0000 mL | INTRAVENOUS | Status: DC | PRN

## 2015-11-24 MED ORDER — LIDOCAINE HCL (PF) 1 % IJ SOLN
30.0000 mL | INTRAMUSCULAR | Status: DC | PRN
  Administered 2015-11-25: 30 mL via SUBCUTANEOUS
  Filled 2015-11-24: qty 30

## 2015-11-24 MED ORDER — ZOLPIDEM TARTRATE 5 MG PO TABS: 5.0000 mg | ORAL_TABLET | Freq: Every evening | ORAL | Status: DC | PRN

## 2015-11-24 MED ORDER — PENICILLIN G POTASSIUM 5000000 UNITS IJ SOLR
2.5000 10*6.[IU] | INTRAVENOUS | Status: DC
  Administered 2015-11-24 – 2015-11-25 (×4): 2.5 10*6.[IU] via INTRAVENOUS
  Filled 2015-11-24 (×6): qty 2.5

## 2015-11-24 MED ORDER — OXYTOCIN BOLUS FROM INFUSION: 500.0000 mL | INTRAVENOUS | Status: DC

## 2015-11-24 MED ORDER — ONDANSETRON HCL 4 MG/2ML IJ SOLN
4.0000 mg | Freq: Four times a day (QID) | INTRAMUSCULAR | Status: DC | PRN
  Administered 2015-11-24: 4 mg via INTRAVENOUS
  Filled 2015-11-24: qty 2

## 2015-11-24 MED ORDER — BUTORPHANOL TARTRATE 1 MG/ML IJ SOLN
1.0000 mg | INTRAMUSCULAR | Status: DC | PRN
  Administered 2015-11-24: 1 mg via INTRAVENOUS
  Filled 2015-11-24: qty 1

## 2015-11-24 NOTE — Progress Notes (Signed)
  Subjective: Comfortable now s/p epidural.    Objective: BP 105/53 mmHg  Pulse 99  Temp(Src) 98.3 F (36.8 C) (Oral)  Resp 18  Ht 5' (1.524 m)  Wt 73.029 kg (161 lb)  BMI 31.44 kg/m2  SpO2 97%  LMP 02/24/2015      FHT: Category 1 at present--had single prolonged decel and scattered variables with drop in BP after epidural, but now resolved and accels present. UC:   irregular, every 2-6 minutes SVE:   Dilation: 3.5 Effacement (%): 80 Station: -1 Exam by:: Manfred ArchV. Mairen Wallenstein CNM  IUPC and FSE applied Pitocin at 6 mu/min  Assessment:  SROM x 14-16 hours GBS positive Early labor, on pitocin GBS positive  Plan: Titrate pitocin to establish/maintain adequacy of labor. Position to facilitate rotation/descent.  Nigel BridgemanLATHAM, Darlin Stenseth CNM 11/24/2015, 11:41 PM

## 2015-11-24 NOTE — Progress Notes (Signed)
  Subjective: More pain with UCs, nausea.  Received Stadol at 1829, but "didn't work", although patient very drowsy between UCs. Mother at bedside.  Objective: BP 97/51 mmHg  Pulse 67  Temp(Src) 98.5 F (36.9 C) (Oral)  Resp 18  Ht 5' (1.524 m)  Wt 73.029 kg (161 lb)  BMI 31.44 kg/m2  LMP 02/24/2015      FHT: Category 1 UC:   irregular, every 3-6 minutes SVE:   Dilation: 2.5 Effacement (%): 50 Station: -2 Exam by:: meghan garrison rn  at 6:24p Pitocin at 6 mu/min  Assessment:  IUP at 39 weeks SROM x 10-12 hours GBS positive Hx depression Family hx polydactly  Plan: Continue pitocin augmentation Zofran for nausea May proceed with epidural when patient desires.  Christine Ross, Christine Ross CNM 11/24/2015, 8:03 PM

## 2015-11-24 NOTE — MAU Note (Signed)
C/o ?SROM around 0745 this AM;  

## 2015-11-24 NOTE — Anesthesia Preprocedure Evaluation (Signed)
Anesthesia Evaluation  Patient identified by MRN, date of birth, ID band Patient awake    Reviewed: Allergy & Precautions, Patient's Chart, lab work & pertinent test results  Airway Mallampati: II       Dental   Pulmonary former smoker,    Pulmonary exam normal        Cardiovascular negative cardio ROS Normal cardiovascular exam     Neuro/Psych negative neurological ROS     GI/Hepatic negative GI ROS, Neg liver ROS,   Endo/Other  negative endocrine ROS  Renal/GU negative Renal ROS     Musculoskeletal   Abdominal   Peds  Hematology negative hematology ROS (+)   Anesthesia Other Findings   Reproductive/Obstetrics (+) Pregnancy                             Lab Results  Component Value Date   WBC 7.3 11/24/2015   HGB 9.7* 11/24/2015   HCT 28.9* 11/24/2015   MCV 81.6 11/24/2015   PLT 203 11/24/2015    Anesthesia Physical Anesthesia Plan  ASA: II  Anesthesia Plan: Epidural   Post-op Pain Management:    Induction:   Airway Management Planned: Natural Airway  Additional Equipment:   Intra-op Plan:   Post-operative Plan:   Informed Consent: I have reviewed the patients History and Physical, chart, labs and discussed the procedure including the risks, benefits and alternatives for the proposed anesthesia with the patient or authorized representative who has indicated his/her understanding and acceptance.     Plan Discussed with:   Anesthesia Plan Comments:         Anesthesia Quick Evaluation

## 2015-11-24 NOTE — Progress Notes (Signed)
In to check on pt.   Pt resting on her left side. Starting to feel menstrual cramps but was trying to sleep.  Cat 1 tracing.  Continue low dose Pitocin. Pt reminded of options for pain management.  Nigel BridgemanVicki Latham, CNM to assume primary care.  I will continue as back up.  Pt informed.

## 2015-11-24 NOTE — Progress Notes (Signed)
  Subjective: Now requesting epidural.  Objective: BP 113/70 mmHg  Pulse 80  Temp(Src) 98.5 F (36.9 C) (Oral)  Resp 18  Ht 5' (1.524 m)  Wt 73.029 kg (161 lb)  BMI 31.44 kg/m2  LMP 02/24/2015      FHT: Category 1 UC:   regular, every 5 minutes SVE:   Dilation: 2.5 Effacement (%): 50 Station: -2 Exam by:: meghan garrison rn  at [id number] Pitocin at 6 mu/min--had been holding there from previous instructions of Dr. Dion BodyVarnado.  Assessment:  SROM x 15 hours Early labor GBS positive  Plan: Place epidural, then check cervix. Plan IUPC and titrating pitocin after epidural placed.  Christine Ross, Christine Ross CNM 11/24/2015, 10:20 PM

## 2015-11-24 NOTE — H&P (Signed)
Christine Ross is a 25 y.o. female G1 @ 6039 0/7 weeks presenting for c/o ROM.  Pt reports initial leakage at 0730 but disregarded it as urine.  When she awakened around ~0945, she had a large gush of fluid.  Pt denies vaginal bleeding or contractions.  Per pt, cervical exam was 0.5 cm last week in office.  PNC has been with [facility] since 8 weeks.  Uncomplicated.  Pt is GBS+ without PCN allergies.   Maternal Medical History:  Reason for admission: Rupture of membranes.   Contractions: Onset was 13-24 hours ago.   Pt does not feel contractions.  Prenatal complications: no prenatal complications Prenatal Complications - Diabetes: none.    OB History    Gravida Para Term Preterm AB TAB SAB Ectopic Multiple Living   3 0 0 0 2 0 2 0 0 0      Past Medical History  Diagnosis Date  . Insomnia   . Depression     age 25yo, resolved  . Wears glasses   . Routine gynecological examination     Dr. Jean RosenthalJackson  . Eczema   . Chlamydia contact, treated 02-2014   Past Surgical History  Procedure Laterality Date  . Wisdom tooth extraction    . Dilation and evacuation N/A 06/29/2014    Procedure: DILATATION AND EVACUATION;  Surgeon: Brock Badharles A Harper, MD;  Location: [facility];  Service: Gynecology;  Laterality: N/A;  . Dilation and evacuation N/A 08/16/2014    Procedure: DILATATION AND EVACUATION;  Surgeon: Maxie BetterSheronette Cousins, MD;  Location: [facility];  Service: Gynecology;  Laterality: N/A;  . Operative ultrasound N/A 08/16/2014    Procedure: OPERATIVE ULTRASOUND;  Surgeon: Maxie BetterSheronette Cousins, MD;  Location: [facility];  Service: Gynecology;  Laterality: N/A;   Family History: family history includes Diabetes in her maternal grandmother, mother, and paternal grandmother. Social History:  reports that she quit smoking about 8 months ago. Her smoking use included Cigars. She has never used smokeless tobacco. She reports that she does not drink alcohol or use illicit drugs.   Prenatal Transfer Tool  Maternal  Diabetes: No Genetic Screening: Declined Maternal Ultrasounds/Referrals: Normal Fetal Ultrasounds or other Referrals:  None Maternal Substance Abuse:  No Significant Maternal Medications:  None Significant Maternal Lab Results:  Lab values include: Group B Strep positive Other Comments:  H/o multiple digits on hands in sister and niece.  Mother with h/o depression.  Review of Systems  Constitutional: Negative for fever and chills.  Gastrointestinal: Negative for abdominal pain.    Dilation: 1 Effacement (%): 50 Station: -2 Exam by:: Dr. Dion BodyVarnado Blood pressure 117/74, pulse 68, temperature 98.1 F (36.7 C), temperature source Oral, resp. rate 18, height 5' (1.524 m), weight 73.029 kg (161 lb), last menstrual period 02/24/2015, unknown if currently breastfeeding. Maternal Exam:  Uterine Assessment: Contraction strength is mild.  Contraction duration is 1 minute. Contraction frequency is regular.  Every 2 min  Abdomen: Estimated fetal weight is 7 lbs.   Fetal presentation: vertex  Introitus: Normal vulva. Ferning test: positive.   Pelvis: adequate for delivery.   Cervix: Cervix evaluated by digital exam.     Fetal Exam Fetal Monitor Review: Mode: fetoscope.   Baseline rate: 130s.  Variability: moderate (6-25 bpm).   Pattern: accelerations present and no decelerations.    Fetal State Assessment: Category I - tracings are normal.     Physical Exam  Constitutional: She is oriented to person, place, and time. She appears well-developed and well-nourished. No distress.  HENT:  Head:  Normocephalic and atraumatic.  Eyes: EOM are normal.  Neck: Normal range of motion.  Respiratory: Effort normal. No respiratory distress.  GI: There is tenderness.  Musculoskeletal: Normal range of motion. She exhibits no tenderness.  Neurological: She is alert and oriented to person, place, and time.  Skin: Skin is warm and dry.  Psychiatric: She has a normal mood and affect.    Prenatal  labs: ABO, Rh: O/Positive/-- (12/02 0000) Antibody: Negative (12/02 0000) Rubella: Immune (12/02 0000) RPR: Nonreactive (12/02 0000)  HBsAg: Negative (12/02 0000)  HIV: Non-reactive (12/02 0000)  GBS:   Positive  Assessment/Plan: IUP@ 39 0/7 weeks, PROM Frequent contractions Cat 1 tracing. GBS positive. H/o depression. Family h/o multiple digits on fingers.  Pt admitted for IOL.  Cytotec po previously ordered as contractions were not tracing in MAU.  Hold Cytotec for now. Continue observation.  Reassess in a few hours.  Start Pitocin if spacing out or pt does not progress to active labor. PCN now for GBS prophylaxis. Pt desires epidural.  IV Stadol and Nitrous Oxide ordered prn. Follow closely postpartum for s/sxs of postpartum depression and anxiety.  Geryl RankinsVARNADO, Hilmar Moldovan 11/24/2015, 1:38 PM

## 2015-11-24 NOTE — Anesthesia Procedure Notes (Signed)
Epidural Patient location during procedure: OB  Staffing Anesthesiologist: Marcene DuosFITZGERALD, Glyndon Tursi Performed by: anesthesiologist   Preanesthetic Checklist Completed: patient identified, site marked, surgical consent, pre-op evaluation, timeout performed, IV checked, risks and benefits discussed and monitors and equipment checked  Epidural Patient position: sitting Prep: site prepped and draped and DuraPrep Patient monitoring: continuous pulse ox and blood pressure Approach: midline Location: L4-L5 Injection technique: LOR air  Needle:  Needle type: Tuohy  Needle gauge: 17 G Needle length: 9 cm and 9 Needle insertion depth: 6 cm Catheter type: closed end flexible Catheter size: 19 Gauge Catheter at skin depth: 12 (11cm initially at the skin. Advanced to 12cm when laid in lat decubitus position.) cm Test dose: negative  Assessment Events: blood not aspirated, injection not painful, no injection resistance, negative IV test and no paresthesia

## 2015-11-25 ENCOUNTER — Encounter (HOSPITAL_COMMUNITY): Payer: Self-pay | Admitting: Obstetrics and Gynecology

## 2015-11-25 LAB — HIV ANTIBODY (ROUTINE TESTING W REFLEX): HIV SCREEN 4TH GENERATION: NONREACTIVE

## 2015-11-25 LAB — RPR: RPR: NONREACTIVE

## 2015-11-25 MED ORDER — ONDANSETRON HCL 4 MG PO TABS: 4.0000 mg | ORAL_TABLET | ORAL | Status: DC | PRN

## 2015-11-25 MED ORDER — PRENATAL MULTIVITAMIN CH
1.0000 | ORAL_TABLET | Freq: Every day | ORAL | Status: DC
  Administered 2015-11-25 – 2015-11-26 (×2): 1 via ORAL
  Filled 2015-11-25 (×2): qty 1

## 2015-11-25 MED ORDER — COCONUT OIL OIL: 1.0000 "application " | TOPICAL_OIL | Status: DC | PRN

## 2015-11-25 MED ORDER — BENZOCAINE-MENTHOL 20-0.5 % EX AERO
1.0000 | INHALATION_SPRAY | CUTANEOUS | Status: DC | PRN
  Administered 2015-11-25: 1 via TOPICAL
  Filled 2015-11-25: qty 56

## 2015-11-25 MED ORDER — ACETAMINOPHEN 325 MG PO TABS
650.0000 mg | ORAL_TABLET | ORAL | Status: DC | PRN
  Administered 2015-11-25 – 2015-11-26 (×2): 650 mg via ORAL
  Filled 2015-11-25 (×2): qty 2

## 2015-11-25 MED ORDER — TETANUS-DIPHTH-ACELL PERTUSSIS 5-2.5-18.5 LF-MCG/0.5 IM SUSP
0.5000 mL | Freq: Once | INTRAMUSCULAR | Status: AC
  Administered 2015-11-26: 0.5 mL via INTRAMUSCULAR
  Filled 2015-11-25: qty 0.5

## 2015-11-25 MED ORDER — IBUPROFEN 600 MG PO TABS
600.0000 mg | ORAL_TABLET | Freq: Four times a day (QID) | ORAL | Status: DC
  Administered 2015-11-25 – 2015-11-27 (×6): 600 mg via ORAL
  Filled 2015-11-25 (×7): qty 1

## 2015-11-25 MED ORDER — OXYCODONE HCL 5 MG PO TABS: 10.0000 mg | ORAL_TABLET | ORAL | Status: DC | PRN

## 2015-11-25 MED ORDER — WITCH HAZEL-GLYCERIN EX PADS: 1.0000 | MEDICATED_PAD | CUTANEOUS | Status: DC | PRN

## 2015-11-25 MED ORDER — ZOLPIDEM TARTRATE 5 MG PO TABS: 5.0000 mg | ORAL_TABLET | Freq: Every evening | ORAL | Status: DC | PRN

## 2015-11-25 MED ORDER — SENNOSIDES-DOCUSATE SODIUM 8.6-50 MG PO TABS
2.0000 | ORAL_TABLET | ORAL | Status: DC
  Administered 2015-11-25 – 2015-11-26 (×2): 2 via ORAL
  Filled 2015-11-25 (×2): qty 2

## 2015-11-25 MED ORDER — SIMETHICONE 80 MG PO CHEW
80.0000 mg | CHEWABLE_TABLET | ORAL | Status: DC | PRN
Start: 2015-11-25 — End: 2015-11-27

## 2015-11-25 MED ORDER — OXYCODONE HCL 5 MG PO TABS
5.0000 mg | ORAL_TABLET | ORAL | Status: DC | PRN
Start: 2015-11-25 — End: 2015-11-27
  Administered 2015-11-25: 5 mg via ORAL
  Filled 2015-11-25: qty 1

## 2015-11-25 MED ORDER — DIBUCAINE 1 % RE OINT: 1.0000 "application " | TOPICAL_OINTMENT | RECTAL | Status: DC | PRN

## 2015-11-25 MED ORDER — DIPHENHYDRAMINE HCL 25 MG PO CAPS: 25.0000 mg | ORAL_CAPSULE | Freq: Four times a day (QID) | ORAL | Status: DC | PRN

## 2015-11-25 MED ORDER — ONDANSETRON HCL 4 MG/2ML IJ SOLN
4.0000 mg | INTRAMUSCULAR | Status: DC | PRN
Start: 2015-11-25 — End: 2015-11-27

## 2015-11-25 NOTE — Progress Notes (Signed)
Subjective: Postpartum Day 0: Vaginal delivery, bilateral periurethral and 1st degree vaginal lacerations Patient up ad lib, reports no syncope or dizziness. Feeding:  Breast and bottle Contraceptive plan:  Undecided  Objective: Vital signs in last 24 hours: Temp:  [97.9 F (36.6 C)-99.4 F (37.4 C)] 98.7 F (37.1 C) (07/02 1953) Pulse Rate:  [67-251] 84 (07/02 1953) Resp:  [16-18] 18 (07/02 1953) BP: (57-142)/(32-104) 124/60 mmHg (07/02 1953) SpO2:  [97 %-100 %] 100 % (07/02 1953)  Physical Exam:  General: alert Lochia: appropriate Uterine Fundus: firm Perineum: healing well DVT Evaluation: No evidence of DVT seen on physical exam. Negative Homan's sign.   CBC Latest Ref Rng 11/24/2015 08/16/2014 08/08/2014  WBC 4.0 - 10.5 K/uL 7.3 5.8 6.3  Hemoglobin 12.0 - 15.0 g/dL 1.6(X9.7(L) 11.1(L) 11.8(L)  Hematocrit 36.0 - 46.0 % 28.9(L) 34.4(L) 35.5(L)  Platelets 150 - 400 K/uL 203 243 268     Assessment/Plan: Status post vaginal delivery day 0. Pregnancy-related anemia Stable Continue current care. Check CBC tomorrow.     Nyra CapesLATHAM, VICKICNM 11/25/2015, 9:19 PM

## 2015-11-25 NOTE — Progress Notes (Signed)
Called with update on patient--series of variable decels, patient complete, +1.  Aware of minimal pressure, no active urge to push.  Objective: BP 115/69 mmHg  Pulse 145  Temp(Src) 98.6 F (37 C) (Oral)  Resp 18  Ht 5' (1.524 m)  Wt 73.029 kg (161 lb)  BMI 31.44 kg/m2  SpO2 97%  LMP 02/24/2015      FHT: Category 2--series of variable decels, but moderate variability, accels present.   UC:   irregular, every 2-6 minutes SVE:   Dilation: 10 Effacement (%): 80 Station: +1 Exam by:: Len ChildsK Lashley RN Off with variable decels.  Assessment:  2nd stage labor, no urge to push SROM x 21 hrs. GBS positive  Plan: Position to facilitate fetal status Restart pitocin Await increased urge to push.  Nigel BridgemanLATHAM, Christine Ross CNM 11/25/2015, 5:10 AM

## 2015-11-25 NOTE — Lactation Note (Signed)
This note was copied from a baby's chart. Lactation Consultation Note  Patient Name: Christine Ross Today's Date: 11/25/2015 Reason for consult: Initial assessment  Visited with Mom, baby 9 hrs old.  RN just started giving baby a bath.  2 attempts to latch baby by nurse, and baby was too sleepy and didn't root.  Baby awake now and showing subtle cues.  Asher MuirJamie RN will assist with placing baby skin to skin following bath, and will assist with latching baby.  Talked about manually expressing colostrum and spoon feeding baby if he doesn't latch.  Mom interested in both formula and breast feeding.  Talked about benefits of baby learning to latch to breast before she introduces formula or EBM by bottle.  Encouraged keeping baby skin to skin, and watching for cues.  To call for assistance with positioning and latch as needed.  LC to follow up in am. Brochure left in room.  Explained about IP and OP Lactation services that are available to her.  Mom knows to call for help prn.  Consult Status: Follow-up Date: 11/26/15 Follow-up type: In-patient    Christine Ross, Christine Ross 11/25/2015, 2:43 PM

## 2015-11-25 NOTE — Anesthesia Postprocedure Evaluation (Signed)
Anesthesia Post Note  Patient: Christine Ross  Procedure(s) Performed: * No procedures listed *  Patient location during evaluation: Mother Baby Anesthesia Type: Epidural Level of consciousness: awake and alert Pain management: satisfactory to patient Vital Signs Assessment: post-procedure vital signs reviewed and stable Respiratory status: respiratory function stable Cardiovascular status: stable Postop Assessment: no headache, no backache, epidural receding, patient able to bend at knees, no signs of nausea or vomiting and adequate PO intake Anesthetic complications: no Comments: Comfort level was assessed by AnesthesiaTeam and the patient was pleased with the care, interventions, and services provided by the Department of Anesthesia.     Last Vitals:  Filed Vitals:   11/25/15 0647 11/25/15 0720  BP: 96/60 116/61  Pulse: 104 74  Temp:  37.1 C  Resp: 18 18    Last Pain:  Filed Vitals:   11/25/15 0821  PainSc: Asleep   Pain Goal: Patients Stated Pain Goal: 4 (11/24/15 1229)               Karleen DolphinFUSSELL,Arvie Villarruel

## 2015-11-25 NOTE — Progress Notes (Signed)
  Subjective: Comfortable with epidural.  Objective: BP 106/66 mmHg  Pulse 102  Temp(Src) 98.2 F (36.8 C) (Oral)  Resp 18  Ht 5' (1.524 m)  Wt 73.029 kg (161 lb)  BMI 31.44 kg/m2  SpO2 97%  LMP 02/24/2015      FHT: Category 1 overall--broad accels, moderate variability, but occasional variables with UCs UC:   irregular, every 2-6 minutes SVE:   Dilation: 3.5 Effacement (%): 80 Station: -1 Exam by:: Manfred ArchV. Marchello Rothgeb CNM at [phone] Some segments of MVUs 200-220, then segments of coupling and widely spaced UCs. Pitocin at 6 mu/min  Assessment:  SROM x 19 hours Early labor GBS positive  Plan: Continue pitocin to maintain consistently adequate UCs. Position to facilitate rotation/descent.  Nigel BridgemanLATHAM, Nabria Nevin CNM 11/25/2015, 1:53 AM

## 2015-11-26 LAB — CBC
HEMATOCRIT: 27.7 % — AB (ref 36.0–46.0)
Hemoglobin: 9.1 g/dL — ABNORMAL LOW (ref 12.0–15.0)
MCH: 26.9 pg (ref 26.0–34.0)
MCHC: 32.9 g/dL (ref 30.0–36.0)
MCV: 82 fL (ref 78.0–100.0)
Platelets: 187 10*3/uL (ref 150–400)
RBC: 3.38 MIL/uL — ABNORMAL LOW (ref 3.87–5.11)
RDW: 14.5 % (ref 11.5–15.5)
WBC: 12.2 10*3/uL — ABNORMAL HIGH (ref 4.0–10.5)

## 2015-11-26 NOTE — Progress Notes (Signed)
RN rounded on Ms.  Christine Ross at 0100 and baby's father was  Sleeping in the couch holding  baby on him . Rn had already educated both parents earlier during the shift. Rn reinforced safe sleep, and wrapped baby and placed him in his crib. Rn will continue educating parents about safe sleep.

## 2015-11-26 NOTE — Lactation Note (Signed)
This note was copied from a baby's chart. Lactation Consultation Note  Patient Name: Christine Ross Today's Date: 11/26/2015   Baby starting to cluster feed today. Mom reports she wants to breast/bottle feed with some feedings. Mom reports she had asked for formula last night, RN set up DEBP for Mom to post pump to supplement per Mom's request but Mom reports she is not getting any EBM with pumping. EBM present with hand expression at this visit. Basic teaching reviewed with Mom. Discussed risk of early supplementation with formula to BF success. Cluster feeding reviewed. Baby finishing a feeding when LC arrived. Reviewed positioning with Mom to help sustain good depth with latch. Nipple was round when baby came off the breast, Mom reports some nipple tenderness, no breakdown noted. Advised to apply EBM, ask for Coconut oil if no improvement. Advised baby should be at the breast 8-12 times in 24 hours. Encouraged to ask for assist as needed with latch.   Maternal Data    Feeding Feeding Type: Breast Fed Length of feed: 20 min  LATCH Score/Interventions                      Lactation Tools Discussed/Used Tools: Pump Breast pump type: Double-Electric Breast Pump   Consult Status      Christine Ross, Christine Ross Ann 11/26/2015, 6:04 PM

## 2015-11-26 NOTE — Progress Notes (Signed)
Post Partum Day 1 Subjective: no complaints, up ad lib, voiding and tolerating PO  Objective: Blood pressure 108/55, pulse 69, temperature 97.8 F (36.6 C), temperature source Oral, resp. rate 20, height 5' (1.524 m), weight 161 lb (73.029 kg), last menstrual period 02/24/2015, SpO2 100 %, unknown if currently breastfeeding.  Physical Exam:  General: alert and cooperative Lochia: appropriate Uterine Fundus: firm Incision: na DVT Evaluation: No evidence of DVT seen on physical exam.   Recent Labs  11/24/15 1245 11/26/15 0611  HGB 9.7* 9.1*  HCT 28.9* 27.7*    Assessment/Plan: Plan for discharge tomorrow and Breastfeeding   LOS: 2 days   Christine Ross A 11/26/2015, 1:15 PM

## 2015-11-27 MED ORDER — IBUPROFEN 600 MG PO TABS: 600.0000 mg | ORAL_TABLET | Freq: Four times a day (QID) | ORAL | Status: DC

## 2015-11-27 MED ORDER — NORETHINDRONE 0.35 MG PO TABS: 1.0000 | ORAL_TABLET | Freq: Every day | ORAL | Status: DC

## 2015-11-27 NOTE — Discharge Summary (Signed)
  Obstetric Discharge Summary  Reason for Admission: onset of labor on 11/24/15 Prenatal Procedures: none Intrapartum Procedures: spontaneous vaginal delivery by Nigel BridgemanVicki Latham CNM on 11/25/15 Postpartum Procedures: none Complications-Operative and Postpartum: 1st degree perineal laceration  HEMOGLOBIN  Date Value Ref Range Status  11/26/2015 9.1* 12.0 - 15.0 g/dL Final  16/10/960408/18/2015 54.014.4 12.2 - 16.2 g/dL Final   HCT  Date Value Ref Range Status  11/26/2015 27.7* 36.0 - 46.0 % Final    Discharge Diagnoses: Term Pregnancy-delivered  Physical exam:   General: normal Lochia: appropriate Uterine Fundus:0 /2 firm non-tender  Extremities: No evidence of DVT seen on physical exam. Edema 1+   Hospital course: uncomplicated  Date: 11/27/2015 Activity: unrestricted Diet: routine Medications: Ibuprofen Condition: stable  Breastfeeding:   Yes.  but considering bottle feeding only Contraception:  Micronor  Instructions: refer to practice specific booklet Discharge to: home   Newborn Data:   Baby female Name: Altamese Dillingmir  Theoren Palka A MD 11/27/2015, 8:06 AM

## 2015-11-27 NOTE — Discharge Instructions (Signed)
Postpartum Care After Vaginal Delivery °After you deliver your newborn (postpartum period), the usual stay in the hospital is 24-72 hours. If there were problems with your labor or delivery, or if you have other medical problems, you might be in the hospital longer.  °While you are in the hospital, you will receive help and instructions on how to care for yourself and your newborn during the postpartum period.  °While you are in the hospital: °· Be sure to tell your nurses if you have pain or discomfort, as well as where you feel the pain and what makes the pain worse. °· If you had an incision made near your vagina (episiotomy) or if you had some tearing during delivery, the nurses may put ice packs on your episiotomy or tear. The ice packs may help to reduce the pain and swelling. °· If you are breastfeeding, you may feel uncomfortable contractions of your uterus for a couple of weeks. This is normal. The contractions help your uterus get back to normal size. °· It is normal to have some bleeding after delivery. °· For the first 1-3 days after delivery, the flow is red and the amount may be similar to a period. °· It is common for the flow to start and stop. °· In the first few days, you may pass some small clots. Let your nurses know if you begin to pass large clots or your flow increases. °· Do not  flush blood clots down the toilet before having the nurse look at them. °· During the next 3-10 days after delivery, your flow should become more watery and pink or brown-tinged in color. °· Ten to fourteen days after delivery, your flow should be a small amount of yellowish-white discharge. °· The amount of your flow will decrease over the first few weeks after delivery. Your flow may stop in 6-8 weeks. Most women have had their flow stop by 12 weeks after delivery. °· You should change your sanitary pads frequently. °· Wash your hands thoroughly with soap and water for at least 20 seconds after changing pads, using  the toilet, or before holding or feeding your newborn. °· You should feel like you need to empty your bladder within the first 6-8 hours after delivery. °· In case you become weak, lightheaded, or faint, call your nurse before you get out of bed for the first time and before you take a shower for the first time. °· Within the first few days after delivery, your breasts may begin to feel tender and full. This is called engorgement. Breast tenderness usually goes away within 48-72 hours after engorgement occurs. You may also notice milk leaking from your breasts. If you are not breastfeeding, do not stimulate your breasts. Breast stimulation can make your breasts produce more milk. °· Spending as much time as possible with your newborn is very important. During this time, you and your newborn can feel close and get to know each other. Having your newborn stay in your room (rooming in) will help to strengthen the bond with your newborn.  It will give you time to get to know your newborn and become comfortable caring for your newborn. °· Your hormones change after delivery. Sometimes the hormone changes can temporarily cause you to feel sad or tearful. These feelings should not last more than a few days. If these feelings last longer than that, you should talk to your caregiver. °· If desired, talk to your caregiver about methods of family planning or contraception. °·   Talk to your caregiver about immunizations. Your caregiver may want you to have the following immunizations before leaving the hospital: °· Tetanus, diphtheria, and pertussis (Tdap) or tetanus and diphtheria (Td) immunization. It is very important that you and your family (including grandparents) or others caring for your newborn are up-to-date with the Tdap or Td immunizations. The Tdap or Td immunization can help protect your newborn from getting ill. °· Rubella immunization. °· Varicella (chickenpox) immunization. °· Influenza immunization. You should  receive this annual immunization if you did not receive the immunization during your pregnancy. °  °This information is not intended to replace advice given to you by your health care provider. Make sure you discuss any questions you have with your health care provider. °  °Document Released: 03/09/2007 Document Revised: 02/04/2012 Document Reviewed: 01/07/2012 °Elsevier Interactive Patient Education ©2016 [employer] ° °Iron-Rich Diet °Iron is a mineral that helps your body to produce hemoglobin. Hemoglobin is a protein in your red blood cells that carries oxygen to your body's tissues. Eating too little iron may cause you to feel weak and tired, and it can increase your risk for infection. Eating enough iron is necessary for your body's metabolism, muscle function, and nervous system. °Iron is naturally found in many foods. It can also be added to foods or fortified in foods. There are two types of dietary iron: °· Heme iron. Heme iron is absorbed by the body more easily than nonheme iron. Heme iron is found in meat, poultry, and fish. °· Nonheme iron. Nonheme iron is found in dietary supplements, iron-fortified grains, beans, and vegetables. °You may need to follow an iron-rich diet if: °· You have been diagnosed with iron deficiency or iron-deficiency anemia. °· You have a condition that prevents you from absorbing dietary iron, such as: °¨ Infection in your intestines. °¨ Celiac disease. This involves long-lasting (chronic) inflammation of your intestines. °· You do not eat enough iron. °· You eat a diet that is high in foods that impair iron absorption. °· You have lost a lot of blood. °· You have heavy bleeding during your menstrual cycle. °· You are pregnant. °WHAT IS MY PLAN? °Your health care provider may help you to determine how much iron you need per day based on your condition. Generally, when a person consumes sufficient amounts of iron in the diet, the following iron needs are met: °· Men. °¨ 14-18  years old: 11 mg per day. °¨ 19-50 years old: 8 mg per day. °· Women.   °¨ 14-18 years old: 15 mg per day. °¨ 19-50 years old: 18 mg per day. °¨ Over 50 years old: 8 mg per day. °¨ Pregnant women: 27 mg per day. °¨ Breastfeeding women: 9 mg per day. °WHAT DO I NEED TO KNOW ABOUT AN IRON-RICH DIET? °· Eat fresh fruits and vegetables that are high in vitamin C along with foods that are high in iron. This will help increase the amount of iron that your body absorbs from food, especially with foods containing nonheme iron. Foods that are high in vitamin C include oranges, peppers, tomatoes, and mango. °· Take iron supplements only as directed by your health care provider. Overdose of iron can be life-threatening. If you were prescribed iron supplements, take them with orange juice or a vitamin C supplement. °· Cook foods in pots and pans that are made from iron.   °· Eat nonheme iron-containing foods alongside foods that are high in heme iron. This helps to improve your iron absorption.   °·   Certain foods and drinks contain compounds that impair iron absorption. Avoid eating these foods in the same meal as iron-rich foods or with iron supplements. These include: °¨ Coffee, black tea, and red wine. °¨ Milk, dairy products, and foods that are high in calcium. °¨ Beans, soybeans, and peas. °¨ Whole grains. °· When eating foods that contain both nonheme iron and compounds that impair iron absorption, follow these tips to absorb iron better.   °¨ Soak beans overnight before cooking. °¨ Soak whole grains overnight and drain them before using. °¨ Ferment flours before baking, such as using yeast in bread dough. °WHAT FOODS CAN I EAT? °Grains  °Iron-fortified breakfast cereal. Iron-fortified whole-wheat bread. Enriched rice. Sprouted grains. °Vegetables  °Spinach. Potatoes with skin. Green peas. Broccoli. Red and green bell peppers. Fermented vegetables. °Fruits  °Prunes. Raisins. Oranges. Strawberries. Mango.  Grapefruit. °Meats and Other Protein Sources  °Beef liver. Oysters. Beef. Shrimp. Turkey. Chicken. Tuna. Sardines. Chickpeas. Nuts. Tofu. °Beverages  °Tomato juice. Fresh orange juice. Prune juice. Hibiscus tea. Fortified instant breakfast shakes. °Condiments  °Tahini. Fermented soy sauce.  °Sweets and Desserts  °Black-strap molasses.  °Other  °Wheat germ. °The items listed above may not be a complete list of recommended foods or beverages. Contact your dietitian for more options.  °WHAT FOODS ARE NOT RECOMMENDED? °Grains  °Whole grains. Bran cereal. Bran flour. Oats. °Vegetables  °Artichokes. Brussels sprouts. Kale. °Fruits  °Blueberries. Raspberries. Strawberries. Figs. °Meats and Other Protein Sources  °Soybeans. Products made from soy protein. °Dairy  °Milk. Cream. Cheese. Yogurt. Cottage cheese. °Beverages  °Coffee. Black tea. Red wine. °Sweets and Desserts  °Cocoa. Chocolate. Ice cream. °Other  °Basil. Oregano. Parsley. °The items listed above may not be a complete list of foods and beverages to avoid. Contact your dietitian for more information.  °  °This information is not intended to replace advice given to you by your health care provider. Make sure you discuss any questions you have with your health care provider. °  °Document Released: 12/24/2004 Document Revised: 06/02/2014 Document Reviewed: 12/07/2013 °Elsevier Interactive Patient Education ©2016 Elsevier Inc. ° °

## 2015-11-27 NOTE — Lactation Note (Addendum)
This note was copied from a baby's chart. Lactation Consultation Note  Baby is not BF or bottle feeding well.  He does not elevate his tongue to compress the breast and fatigues with bottle feeding.  Baby is not attaching to the breast and suckling to transfer milk even with a NS and mother is reporting sore nipples.  He took 11 ml of formula from a bottle and though he was still cueing he would not make a seal to eat. Attempted cheek squeezes and chin support without success.  Explained to mother that she may need to give him a short break and then try again.  Her milk is coming to volume but she has decided to formula feed. She has no desire to express her breast milk for [facility]. Aware that she can call with questions. Informed RN of findings and explained need to change formula to Similac Advanced.  Patient Name: Christine Ross Today's Date: 11/27/2015 Reason for consult: Difficult latch   Maternal Data    Feeding Feeding Type: Breast Fed  LATCH Score/Interventions Latch: Repeated attempts needed to sustain latch, nipple held in mouth throughout feeding, stimulation needed to elicit sucking reflex.  Audible Swallowing: A few with stimulation  Type of Nipple: Everted at rest and after stimulation  Comfort (Breast/Nipple): Filling, red/small blisters or bruises, mild/mod discomfort     Hold (Positioning): Assistance needed to correctly position infant at breast and maintain latch.  LATCH Score: 6  Lactation Tools Discussed/Used     Consult Status Consult Status: Complete    Soyla DryerJoseph, Krishna Dancel 11/27/2015, 9:28 AM

## 2015-11-29 ENCOUNTER — Telehealth: Payer: Self-pay | Admitting: Medical

## 2015-11-29 NOTE — Telephone Encounter (Signed)
Called and left message.

## 2015-11-29 NOTE — Telephone Encounter (Signed)
pls call and congratulate on their recent newborn's birth!  Let them know we were thinking of them.     If they haven't chosen a medical office for their newborn, we see babies too for well visits, taking new patients.

## 2016-02-25 ENCOUNTER — Emergency Department (HOSPITAL_COMMUNITY)
Admission: EM | Admit: 2016-02-25 | Discharge: 2016-02-25 | Disposition: A | Discharge status: Home / Self Care | Payer: BC Managed Care – PPO | Attending: Emergency Medicine | Admitting: Emergency Medicine

## 2016-02-25 ENCOUNTER — Encounter (HOSPITAL_COMMUNITY): Payer: Self-pay | Admitting: Emergency Medicine

## 2016-02-25 ENCOUNTER — Ambulatory Visit: Payer: BC Managed Care – PPO | Admitting: Family Medicine

## 2016-02-25 DIAGNOSIS — H1031 Unspecified acute conjunctivitis, right eye: Secondary | ICD-10-CM | POA: Diagnosis not present

## 2016-02-25 DIAGNOSIS — Z87891 Personal history of nicotine dependence: Secondary | ICD-10-CM | POA: Diagnosis not present

## 2016-02-25 MED ORDER — BACITRACIN-POLYMYXIN B 500-10000 UNIT/GM OP OINT: 1.0000 "application " | TOPICAL_OINTMENT | Freq: Four times a day (QID) | OPHTHALMIC | 0 refills | Status: AC

## 2016-02-25 NOTE — ED Provider Notes (Signed)
[facility]-EMERGENCY DEPT Provider Note   CSN: [id number] Arrival date & time: 02/25/16  2121     History   Chief Complaint Chief Complaint  Patient presents with  . Conjunctivitis    HPI Christine Ross is a 25 y.o. female.  Patient is 25 yo F presenting with chief complaint of right eye redness and irritation starting last night around 8:00. She states her right eye has been progressively more irritated, itchy, and noted thin yellow drainage when she wiped her eye with a tissue. Also had matting of her eyelashes when she woke up this morning. Denies any irritation, drainage, or visual changes in her left eye. Tried cold compresses and Visine drops, but it provided no relief. Denies any eye injury, sensation of foreign body, or visual changes. Her significant other also has similar symptoms.      Past Medical History:  Diagnosis Date  . Chlamydia contact, treated 02-2014  . Chlamydial cervicitis 02/28/2014   Treated.   . Depression    age 88yo, resolved  . Eczema   . GC (gonococcus) 02/28/2014   Sexual contact.  Negative culture, per patient.  Treated for contact.   . Insomnia   . Routine gynecological examination    Dr. Jean Rosenthal  . Wears glasses     Patient Active Problem List   Diagnosis Date Noted  . Vaginal delivery 11/25/2015  . Full-term PROM with onset of labor within 24 hours of rupture 11/24/2015  . Chlamydial cervicitis 02/28/2014  . GC (gonococcus) 02/28/2014    Past Surgical History:  Procedure Laterality Date  . DILATION AND EVACUATION N/A 06/29/2014   Procedure: DILATATION AND EVACUATION;  Surgeon: Brock Bad, MD;  Location: [facility];  Service: Gynecology;  Laterality: N/A;  . DILATION AND EVACUATION N/A 08/16/2014   Procedure: DILATATION AND EVACUATION;  Surgeon: Maxie Better, MD;  Location: [facility];  Service: Gynecology;  Laterality: N/A;  . OPERATIVE ULTRASOUND N/A 08/16/2014   Procedure: OPERATIVE ULTRASOUND;  Surgeon: Maxie Better, MD;   Location: [facility];  Service: Gynecology;  Laterality: N/A;  . WISDOM TOOTH EXTRACTION      OB History    Gravida Para Term Preterm AB Living   3 1 1  0 2 1   SAB TAB Ectopic Multiple Live Births   2 0 0 0 1       Home Medications    Prior to Admission medications   Medication Sig Start Date End Date Taking? Authorizing Provider  ibuprofen (ADVIL,MOTRIN) 600 MG tablet Take 1 tablet (600 mg total) by mouth every 6 (six) hours. 11/27/15   Silverio Lay, MD  norethindrone (ORTHO MICRONOR) 0.35 MG tablet Take 1 tablet (0.35 mg total) by mouth daily. To start Sunday 12/16/15 11/27/15   Silverio Lay, MD    Family History Family History  Problem Relation Age of Onset  . Diabetes Mother   . Diabetes Maternal Grandmother   . Diabetes Paternal Grandmother     Social History Social History  Substance Use Topics  . Smoking status: Former Smoker    Types: Cigars    Quit date: 02/24/2015  . Smokeless tobacco: Never Used  . Alcohol use No     Comment: social     Allergies   Nickel   Review of Systems Review of Systems  Constitutional: Negative for chills and fever.  HENT: Negative for ear pain, rhinorrhea and sore throat.   Eyes: Positive for discharge, redness and itching. Negative for pain and visual disturbance.  Skin: Negative for  rash.     Physical Exam Updated Vital Signs BP 139/96 (BP Location: Left Arm)   Pulse 88   Temp 98.3 F (36.8 C) (Oral)   Resp 18   Ht 5' (1.524 m)   Wt 58.1 kg   SpO2 100%   BMI 25.00 kg/m   Physical Exam  Constitutional: She appears well-developed and well-nourished. No distress.  HENT:  Head: Normocephalic and atraumatic.  Right Ear: Tympanic membrane normal.  Left Ear: Tympanic membrane normal.  Mouth/Throat: Oropharynx is clear and moist.  Eyes: EOM and lids are normal. Pupils are equal, round, and reactive to light. Lids are everted and swept, no foreign bodies found. Right eye exhibits discharge (Thin yellow discharge noted at  medial canthus). No foreign body present in the right eye. Left eye exhibits no discharge. No foreign body present in the left eye. Right conjunctiva is injected. Left conjunctiva is not injected.  Neck: Normal range of motion. Neck supple.  Cardiovascular: Normal rate.   Pulmonary/Chest: Effort normal. No respiratory distress.  Musculoskeletal: Normal range of motion.  Lymphadenopathy:    She has no cervical adenopathy.  Neurological: She is alert.  Skin: Skin is warm and dry. No rash noted.     ED Treatments / Results  Labs (all labs ordered are listed, but only abnormal results are displayed) Labs Reviewed - No data to display  EKG  EKG Interpretation None       Radiology No results found.  Procedures Procedures (including critical care time)  Medications Ordered in ED Medications - No data to display   Initial Impression / Assessment and Plan / ED Course  I have reviewed the triage vital signs and the nursing notes.  Pertinent labs & imaging results that were available during my care of the patient were reviewed by me and considered in my medical decision making (see chart for details).  Clinical Course   Patient is 25 yo F presenting with right eye redness and irritation. Significant other has similar symptoms, and clinical picture on inspection of eye is consistent with conjunctivitis. Visual acuity 20/20 bilaterally, and no complaints of eye pain or visual deficits. Prescription Polysporin ointment provided, and advised patient to f/u with PCP or return to ED for new or worsening symptoms.  Final Clinical Impressions(s) / ED Diagnoses   Final diagnoses:  Acute conjunctivitis of right eye, unspecified acute conjunctivitis type    New Prescriptions Discharge Medication List as of 02/25/2016 10:15 PM    START taking these medications   Details  bacitracin-polymyxin b (POLYSPORIN) ophthalmic ointment Place 1 application into both eyes every 6 (six) hours. apply  to eye every 6 hours while awake, Starting Mon 02/25/2016, Until Mon 03/03/2016, Print         Ivonne AndrewDaryl F de HealyVillier II, [place] 02/26/16 1539    Margarita Grizzleanielle Ray, MD 02/28/16 [id number]

## 2016-02-25 NOTE — ED Triage Notes (Signed)
Pt. reports right eye redness/irritation from eyelashes with drainage onset last night , denies eye injury / no vision loss.

## 2016-02-28 ENCOUNTER — Encounter: Payer: Self-pay | Admitting: Family Medicine

## 2016-02-28 ENCOUNTER — Ambulatory Visit (INDEPENDENT_AMBULATORY_CARE_PROVIDER_SITE_OTHER): Payer: BC Managed Care – PPO | Admitting: Family Medicine

## 2016-02-28 VITALS — BP 120/80 | HR 72 | Temp 98.1°F | Wt 132.8 lb

## 2016-02-28 DIAGNOSIS — H5789 Other specified disorders of eye and adnexa: Secondary | ICD-10-CM

## 2016-02-28 DIAGNOSIS — H578 Other specified disorders of eye and adnexa: Secondary | ICD-10-CM

## 2016-02-28 NOTE — Progress Notes (Signed)
   Subjective:    Patient ID: Christine CellaArnetia Tilly, female    DOB: 11/08/1990, 25 y.o.   MRN: [id number]  HPI Chief Complaint  Patient presents with  . pink eye    pink eye, swollen, went to urgent care and given ointment and feels like its not helping   Complains of a 5 day history of right eye redness, drainage and tenderness. Occasional blurry vision. She does not wear contact lenses but has eyeglasses. No photosensitivity. Denies foreign body sensation. States she initially felt like she had some "eyelash irritation" and used clear eyes. States she went to urgent care 3 days ago and was diagnosed with conjunctivitis and prescribed an antibiotic ointment Polysporin. States no improvement and thinks symptoms are actually worsening. Has also tried warm compresses.  Denies fever, chills, pain with eye movement.    Review of Systems Pertinent positives and negatives in the history of present illness.     Objective:   Physical Exam  Constitutional: She is oriented to person, place, and time. She appears well-developed and well-nourished. No distress.  Eyes: EOM are normal. Pupils are equal, round, and reactive to light. Right eye exhibits discharge. No foreign body present in the right eye. Right conjunctiva is injected. Right conjunctiva has no hemorrhage.  Neck: Normal range of motion. Neck supple.  Lymphadenopathy:    She has no cervical adenopathy.  Neurological: She is alert and oriented to person, place, and time. No cranial nerve deficit.  Skin: Skin is warm and dry. No rash noted. No erythema. No pallor.  Psychiatric: She has a normal mood and affect. Her behavior is normal. Thought content normal.   BP 120/80   Pulse 72   Temp 98.1 F (36.7 C) (Oral)   Wt 132 lb 12.8 oz (60.2 kg)   BMI 25.94 kg/m       Assessment & Plan:  Eye drainage  Eye inflammation  Discussed that her symptoms have not improved with current treatment which makes me suspicious that something is going  on beyond conjunctivitis. Recommend referral to opthalmology for further evaluation. She has an opthomologist and called and will see them later today.

## 2016-09-15 ENCOUNTER — Ambulatory Visit (INDEPENDENT_AMBULATORY_CARE_PROVIDER_SITE_OTHER): Payer: BC Managed Care – PPO | Admitting: Family Medicine

## 2016-09-15 ENCOUNTER — Encounter: Payer: Self-pay | Admitting: Family Medicine

## 2016-09-15 VITALS — BP 104/64 | HR 65 | Temp 98.4°F | Resp 16 | Wt 132.8 lb

## 2016-09-15 DIAGNOSIS — R197 Diarrhea, unspecified: Secondary | ICD-10-CM | POA: Diagnosis not present

## 2016-09-15 DIAGNOSIS — R112 Nausea with vomiting, unspecified: Secondary | ICD-10-CM | POA: Diagnosis not present

## 2016-09-15 NOTE — Patient Instructions (Addendum)
Your symptoms appear to be related to a viral illness and you should continue to improve.  Make sure you are staying well hydrated and eat bland foods for the next 24-48 hours until you are back to normal.  You can try Immodium over the counter if needed for persistent diarrhea.  If you get worse or develop any new symptoms then let me know.   Bland Diet A bland diet consists of foods that do not have a lot of fat or fiber. Foods without fat or fiber are easier for the body to digest. They are also less likely to irritate your mouth, throat, stomach, and other parts of your gastrointestinal tract. A bland diet is sometimes called a BRAT diet. What is my plan? Your health care provider or dietitian may recommend specific changes to your diet to prevent and treat your symptoms, such as:  Eating small meals often.  Cooking food until it is soft enough to chew easily.  Chewing your food well.  Drinking fluids slowly.  Not eating foods that are very spicy, sour, or fatty.  Not eating citrus fruits, such as oranges and grapefruit. What do I need to know about this diet?  Eat a variety of foods from the bland diet food list.  Do not follow a bland diet longer than you have to.  Ask your health care provider whether you should take vitamins. What foods can I eat? Grains   Hot cereals, such as cream of wheat. Bread, crackers, or tortillas made from refined white flour. Rice. Vegetables  Canned or cooked vegetables. Mashed or boiled potatoes. Fruits  Bananas. Applesauce. Other types of cooked or canned fruit with the skin and seeds removed, such as canned peaches or pears. Meats and Other Protein Sources  Scrambled eggs. Creamy peanut butter or other nut butters. Lean, well-cooked meats, such as chicken or fish. Tofu. Soups or broths. Dairy  Low-fat dairy products, such as milk, cottage cheese, or yogurt. Beverages  Water. Herbal tea. Apple juice. Sweets and Desserts  Pudding.  Custard. Fruit gelatin. Ice cream. Fats and Oils  Mild salad dressings. Canola or olive oil. The items listed above may not be a complete list of allowed foods or beverages. Contact your dietitian for more options.  What foods are not recommended? Foods and ingredients that are often not recommended include:  Spicy foods, such as hot sauce or salsa.  Fried foods.  Sour foods, such as pickled or fermented foods.  Raw vegetables or fruits, especially citrus or berries.  Caffeinated drinks.  Alcohol.  Strongly flavored seasonings or condiments. The items listed above may not be a complete list of foods and beverages that are not allowed. Contact your dietitian for more information.  This information is not intended to replace advice given to you by your health care provider. Make sure you discuss any questions you have with your health care provider. Document Released: 09/03/2015 Document Revised: 10/18/2015 Document Reviewed: 05/24/2014 Elsevier Interactive Patient Education  2017 [employer].

## 2016-09-15 NOTE — Progress Notes (Signed)
   Subjective:    Patient ID: Christine Ross, female    DOB: [birth year], 26 y.o.   MRN: [id number]  HPI Chief Complaint  Patient presents with  . sick    niece had stomach virus this weekend, and she woke up yesterday with the virus, diarrhea, vomitting, last diarrhea was 9:15am this morning, no appetite   She is here with complaints of a 2 day history of diarrhea, fatigue, nausea with vomiting. States she has some lower abdominal pain that started last night. Pain is bilateral and intermittent, non radiating. Nothing improves her pain and nothing makes it worse. Vomiting was 2 times yesterday and none since. She denies nausea today. States she ate macaroni and cheese last night and this upset her stomach.  Reports having 4-5 episodes of diarrhea that is brown and loose. Last episode this morning.  She is drinking plenty of fluids.   She has been exposed to a family member with similar symptoms.  No new foods, medications or recent antibiotics.   Denies fever, dizziness, URI symptoms, sore throat, chest pain, palpitations, back pain, vaginal discharge, urinary symptoms.   LMP: Nexplanon since February.  Is not breastfeeding.    Reviewed allergies, medications, past medical, surgical, and social history.    Review of Systems Pertinent positives and negatives in the history of present illness.     Objective:   Physical Exam  Constitutional: She is oriented to person, place, and time. She appears well-developed and well-nourished. She does not have a sickly appearance. No distress.  HENT:  Nose: Nose normal.  Mouth/Throat: Uvula is midline, oropharynx is clear and moist and mucous membranes are normal.  Eyes: Conjunctivae and lids are normal.  Neck: Normal range of motion. Neck supple.  Cardiovascular: Normal rate, regular rhythm, normal heart sounds and normal pulses.  Exam reveals no gallop and no friction rub.   No murmur heard. Pulmonary/Chest: Effort normal and breath  sounds normal.  Abdominal: Soft. Normal appearance and bowel sounds are normal. There is no hepatosplenomegaly. There is no tenderness. There is no rigidity, no rebound, no guarding, no CVA tenderness, no tenderness at McBurney's point and negative Murphy's sign.  Lymphadenopathy:    She has no cervical adenopathy.  Neurological: She is alert and oriented to person, place, and time. She has normal strength. Coordination and gait normal.  Skin: Skin is dry. No rash noted. She is not diaphoretic. No pallor.  Psychiatric: She has a normal mood and affect. Her speech is normal and behavior is normal. Thought content normal.   BP 104/64   Pulse 65   Temp 98.4 F (36.9 C) (Oral)   Resp 16   Wt 132 lb 12.8 oz (60.2 kg)   SpO2 97%   BMI 25.94 kg/m       Assessment & Plan:  Diarrhea, unspecified type  Non-intractable vomiting with nausea, unspecified vomiting type  Discussed that she does not appear infectious and most likely her symptoms are related to a viral illness. Discussed supportive care.  Stay well hydrated, rest, and may take immodium if diarrhea persists. Discussed avoiding diary and eating a bland diet for the next 24-48 hours. She will call if not improving or any new or worsening symptoms.  Offered Zofran but she denies nausea today.

## 2016-10-31 IMAGING — US US OB TRANSVAGINAL
1 series · 14 of 28 positions shown · non-contrast
Comparison: 06/15/2014

CLINICAL DATA: History of missed abortion. Bleeding, cramping.
Status post sided tech injection 9 days ago.

EXAM:
TRANSVAGINAL OB ULTRASOUND
TECHNIQUE: Transvaginal ultrasound was performed for complete evaluation of the
gestation as well as the maternal uterus, adnexal regions, and
pelvic cul-de-sac.

[Series 1: us ob transvaginal · 0.12mm/px · 14 of 60 slices shown]
[im 3/60]
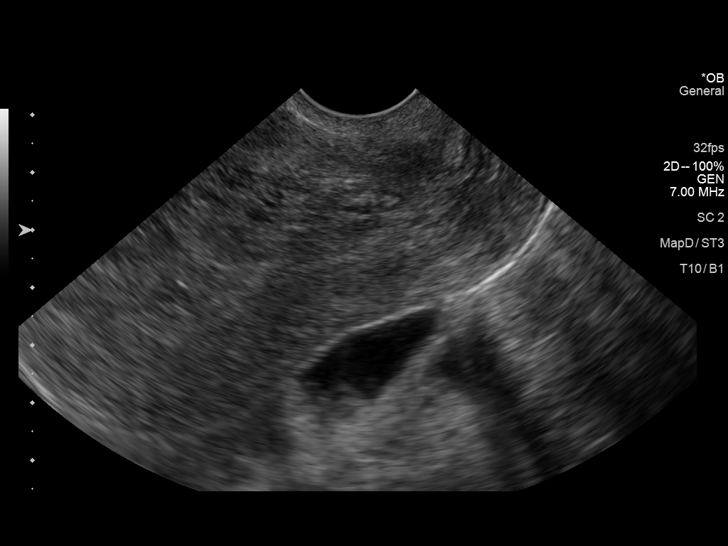
[im 7/60]
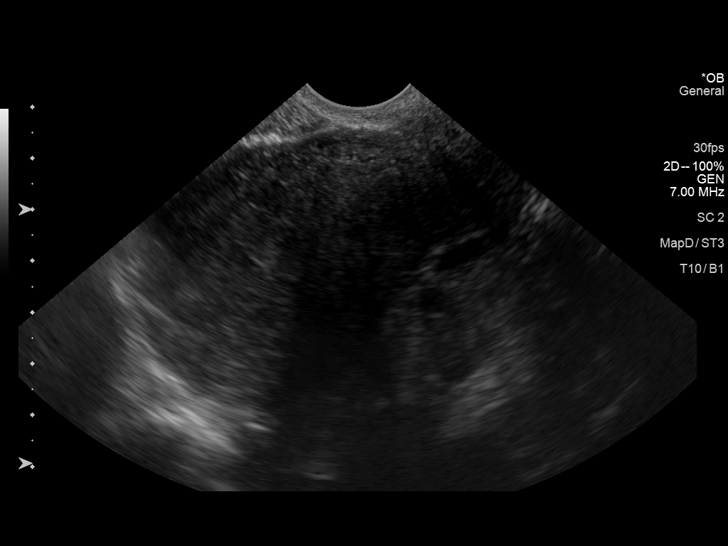
[im 11/60]
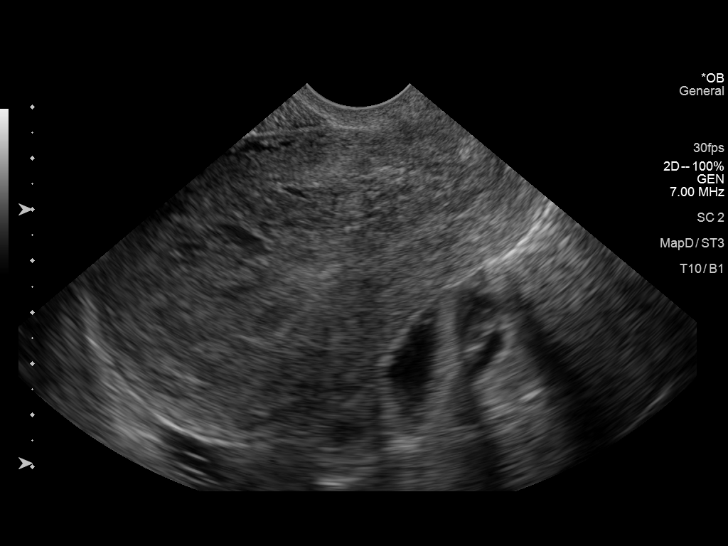
[im 16/60]
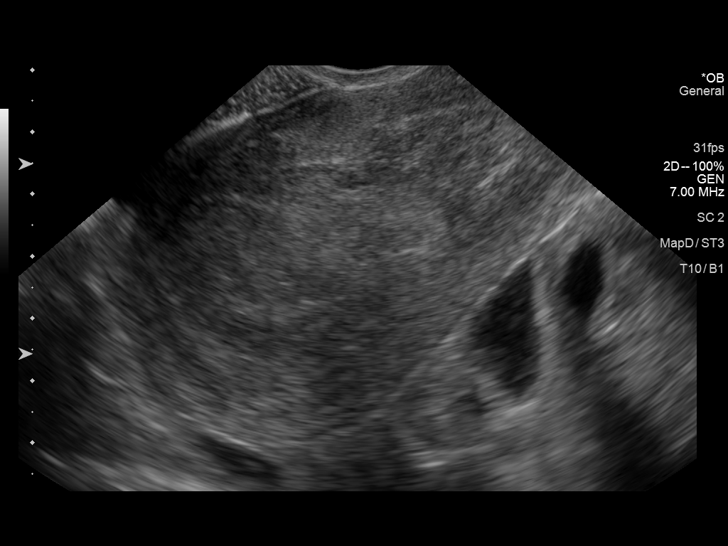
[im 20/60]
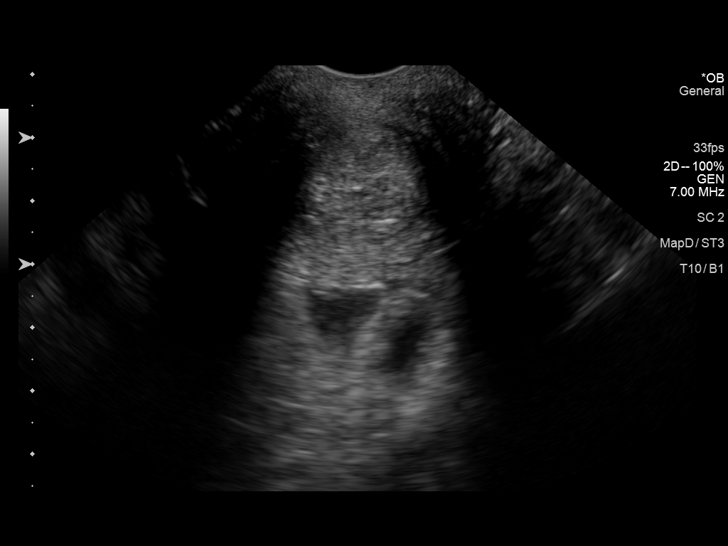
[im 25/60]
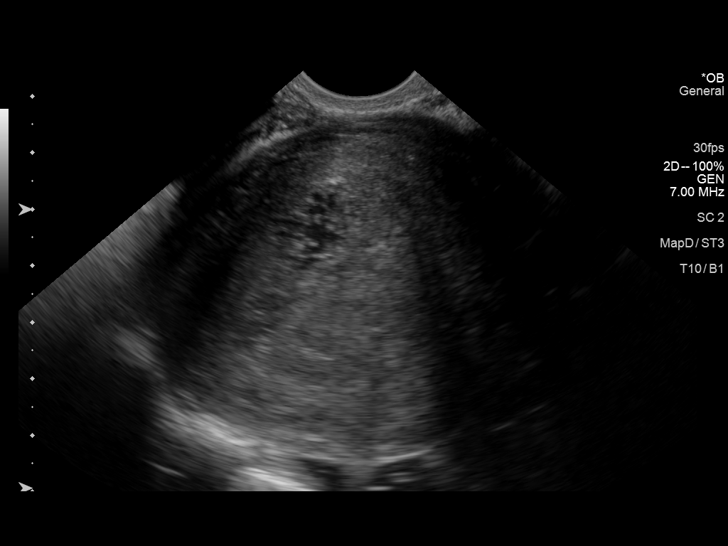
[im 29/60]
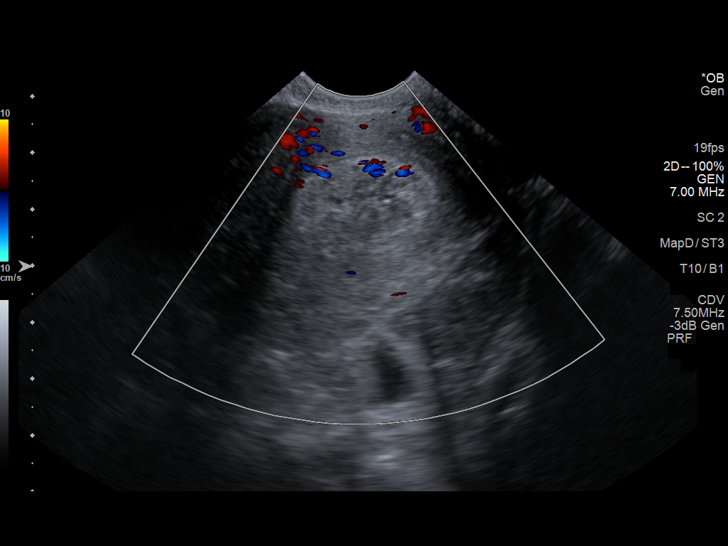
[im 33/60]
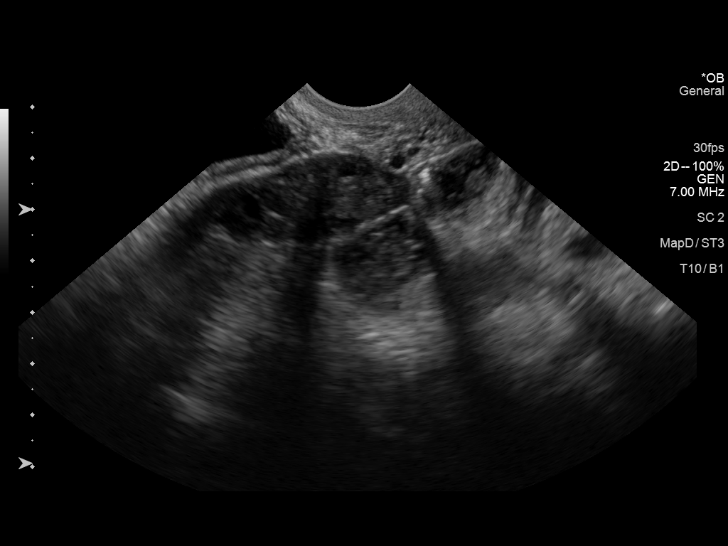
[im 38/60]
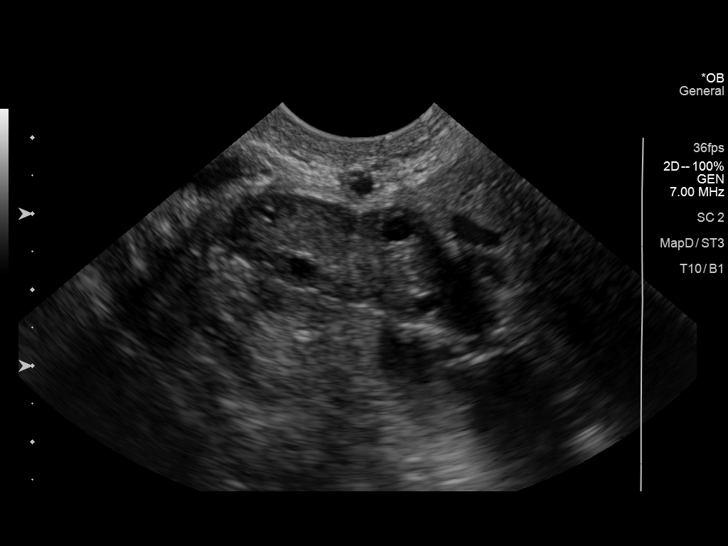
[im 42/60]
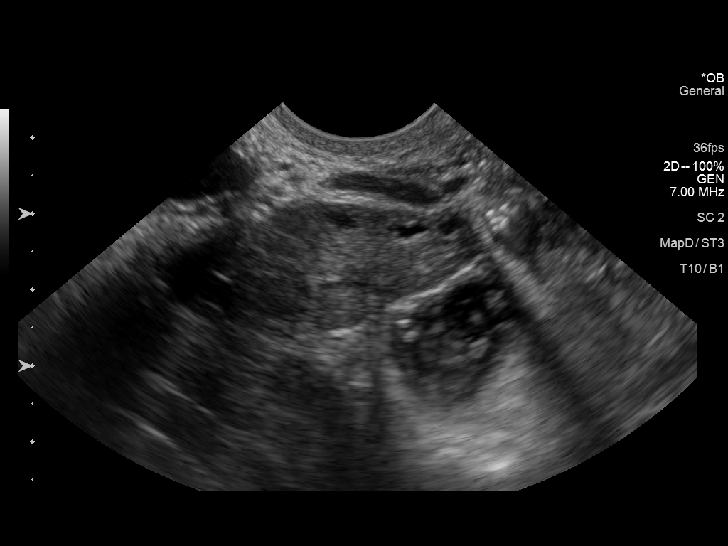
[im 46/60]
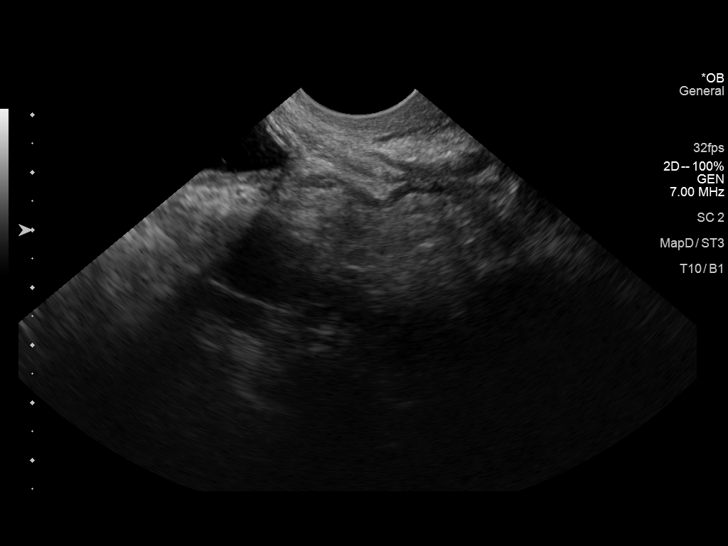
[im 51/60]
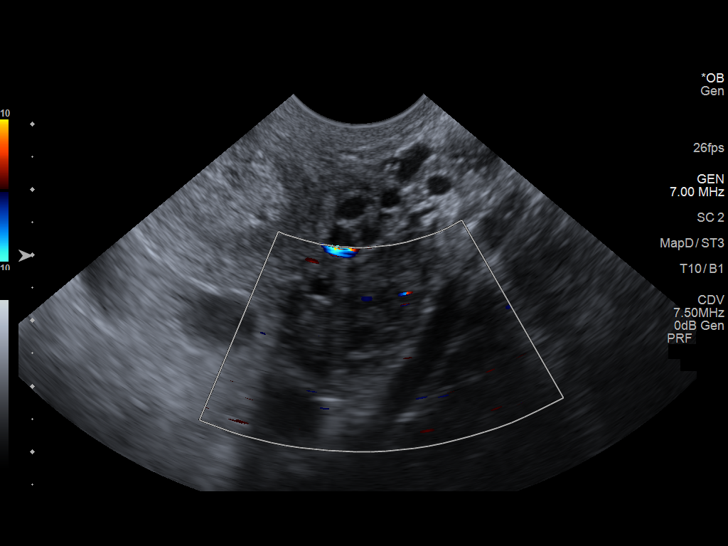
[im 55/60]
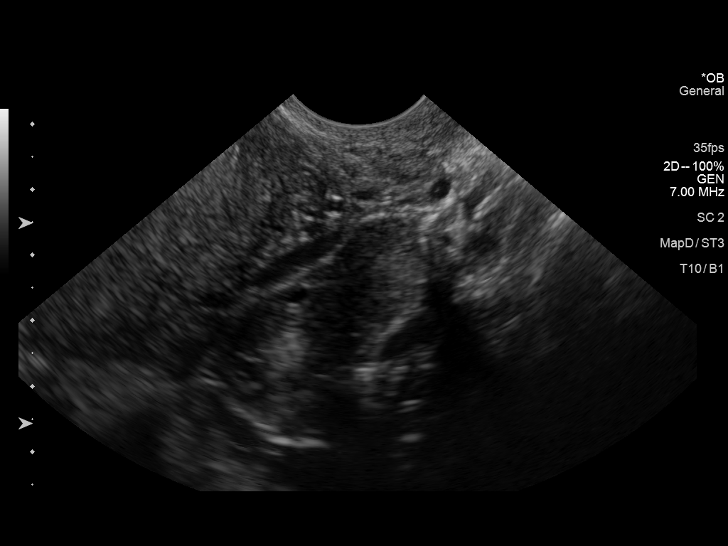
[im 60/60]
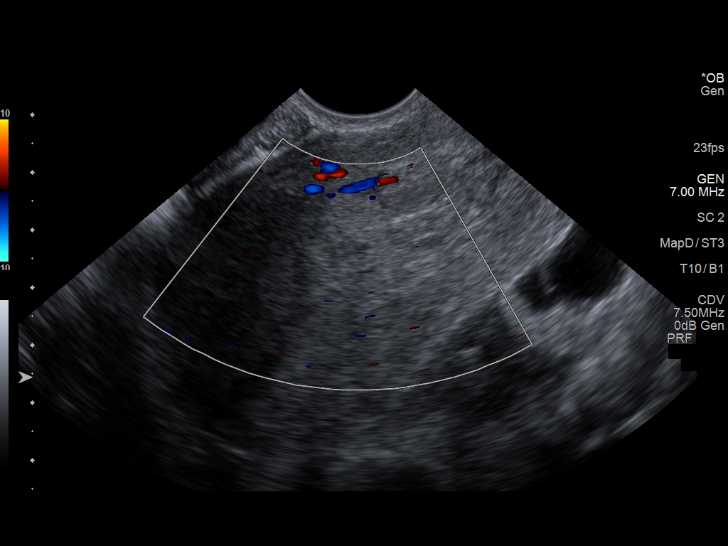

[14 of 28 positions shown; findings below may reference images not displayed]

FINDINGS: Intrauterine gestational sac: None visualized

Yolk sac:  None visualized

Embryo:  None visualized

Maternal uterus/adnexae: Thickening of the endometrium measuring up
to 22 mm. Heterogeneous appearance with internal blood flow
concerning for retained products of conception. There are blood
products also noted within the endometrial canal.
IMPRESSION: Previously seen intrauterine gestational longer visualized. Complex
appearance and thickening of the endometrium with areas of internal
blood flow concerning for retained products of conception.

## 2017-01-25 IMAGING — RF DG HYSTEROGRAM
5 series · 15 of 19 positions shown · IV contrast (omnipaque)
Comparison: None.

FLUOROSCOPY TIME:  Fluoroscopy Time:  54 seconds

CLINICAL DATA: Infertility, early pregnancy loss

EXAM:
HYSTEROSALPINGOGRAM
TECHNIQUE: Following cleansing of the cervix and vagina with Betadine solution,
a hysterosalpingogram was performed using a 5-French
hysterosalpingogram catheter and Omnipaque 300 contrast. The patient
tolerated the examination without difficulty.

[Series 1: run · 4 of 5 slices shown (1 of 5)]
[im 1/5]
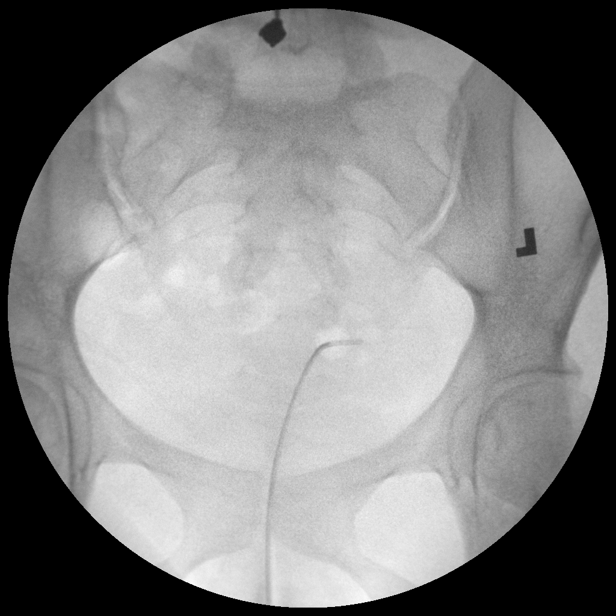
[im 2/5]
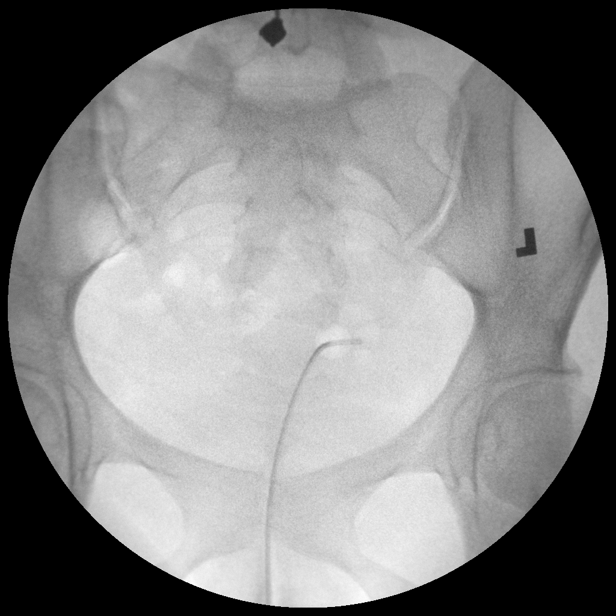
[im 4/5]
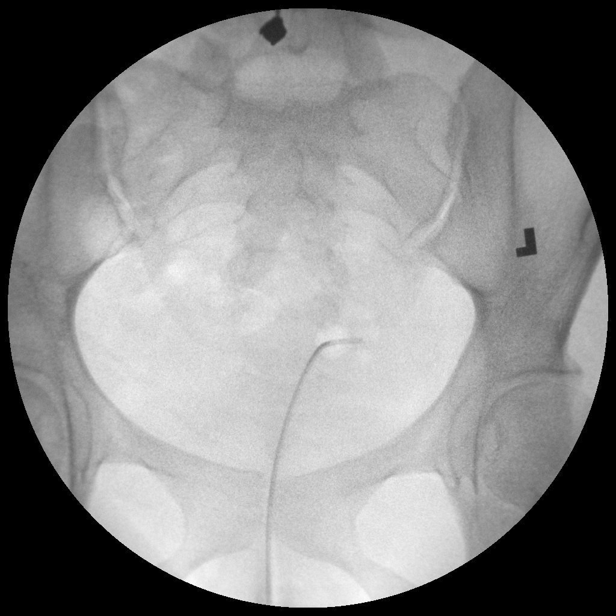
[im 5/5]
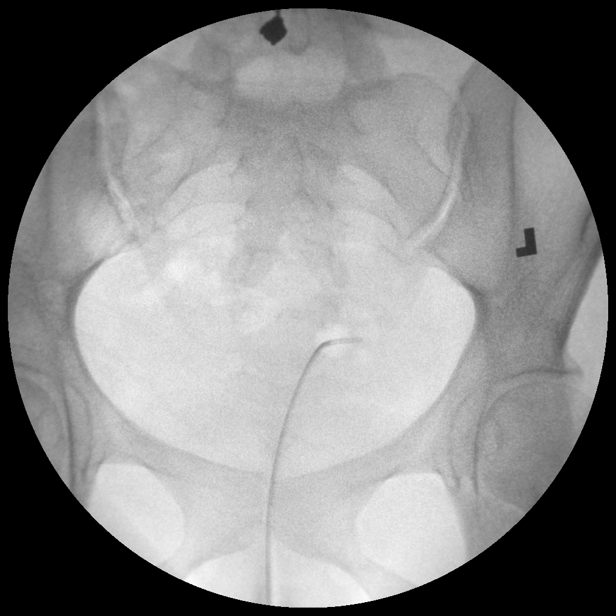

[Series 2: run · 4 of 5 slices shown (2 of 5)]
[im 1/5]
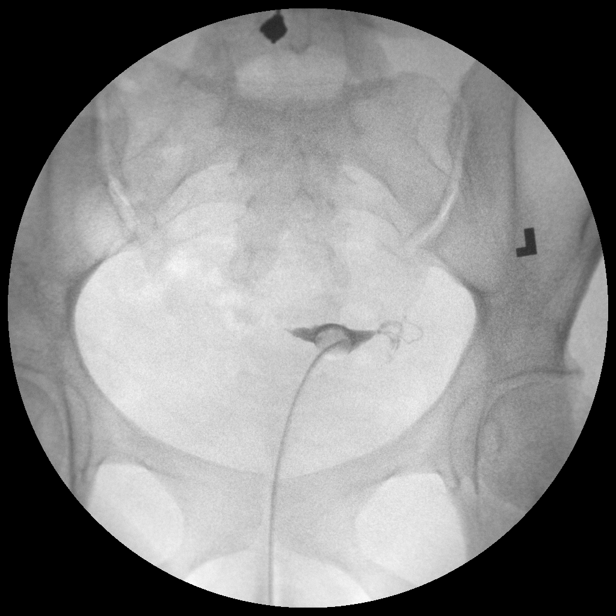
[im 2/5]
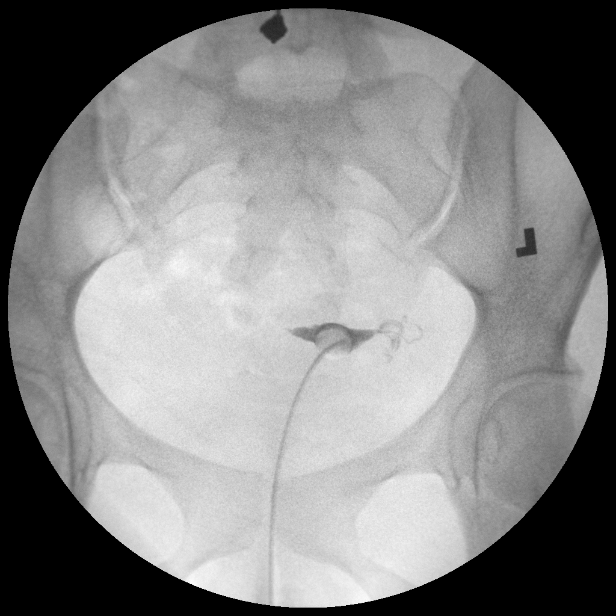
[im 4/5]
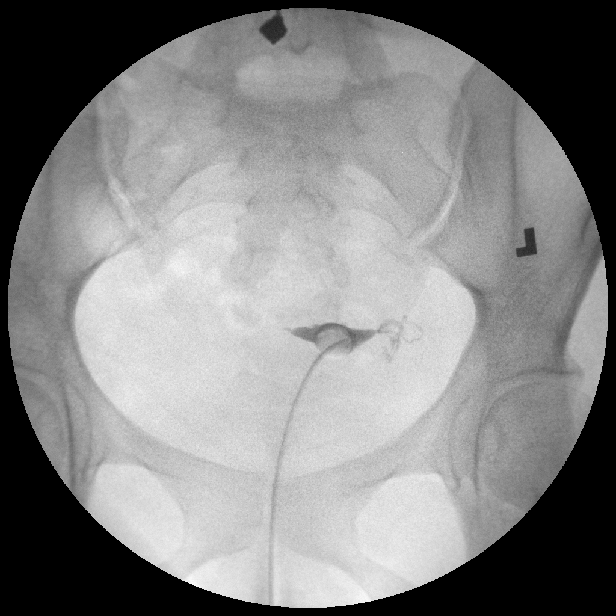
[im 5/5]
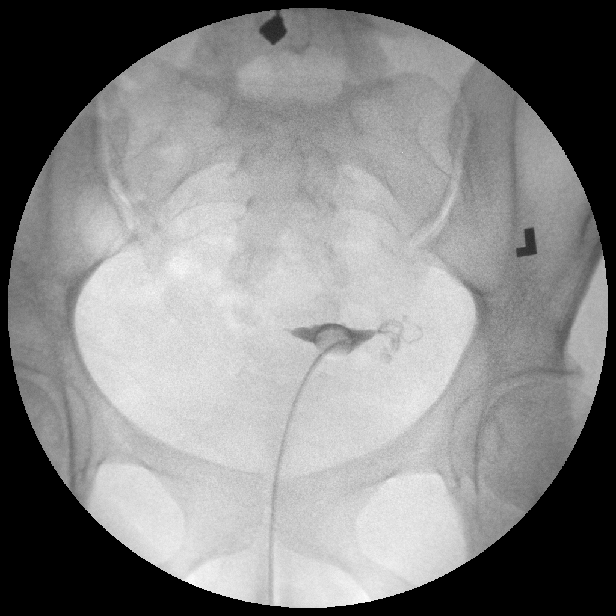

[Series 3: run · 5 of 7 slices shown (3 of 5)]
[im 1/7]
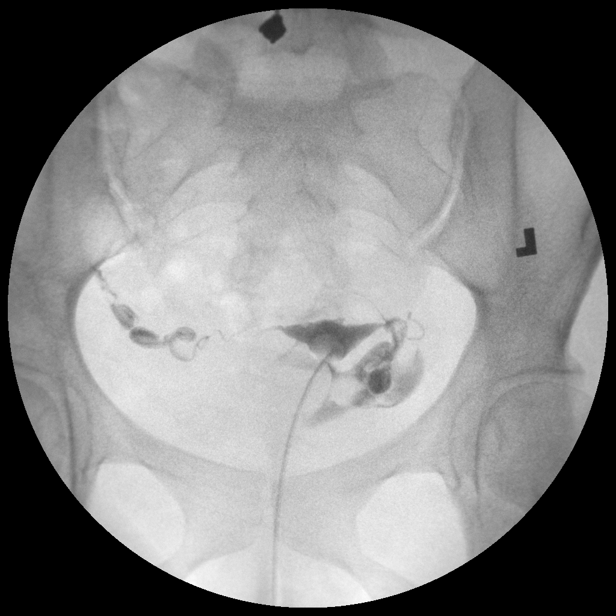
[im 3/7]
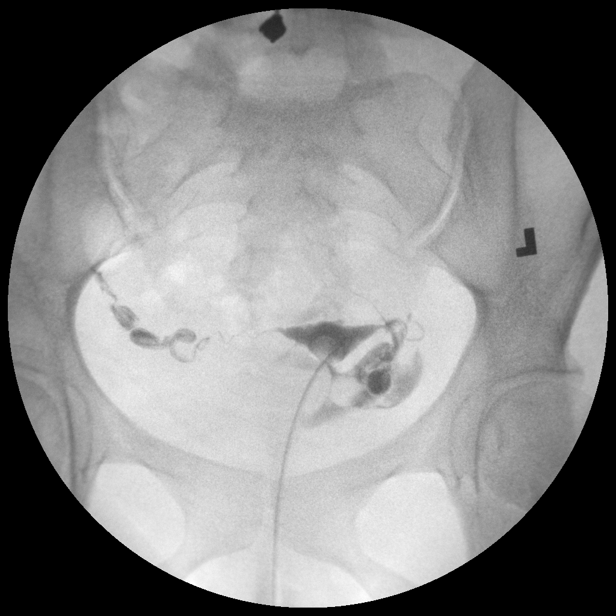
[im 4/7]
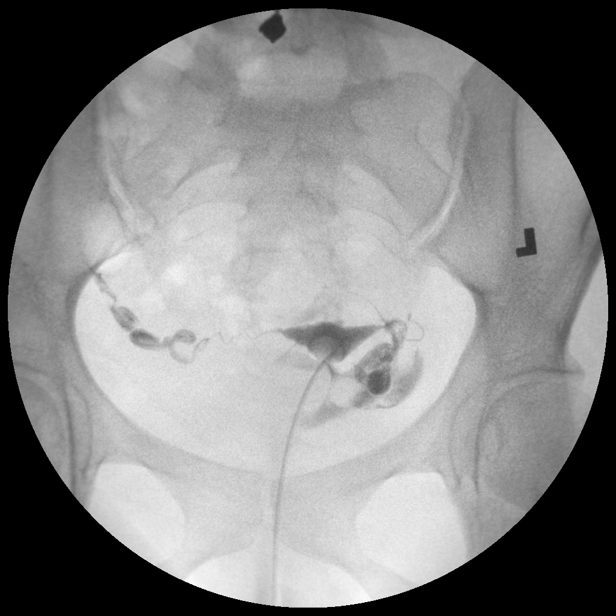
[im 5/7]
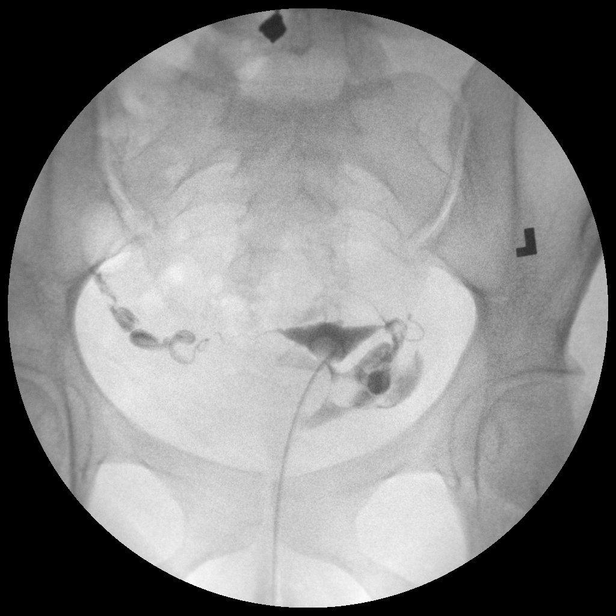
[im 6/7]
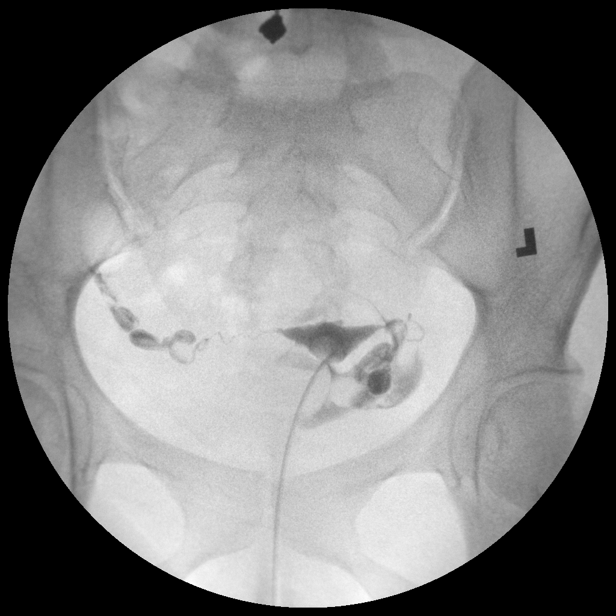

[Series 4: run · 1 of 1 slices shown (4 of 5)]
[im 1/1]
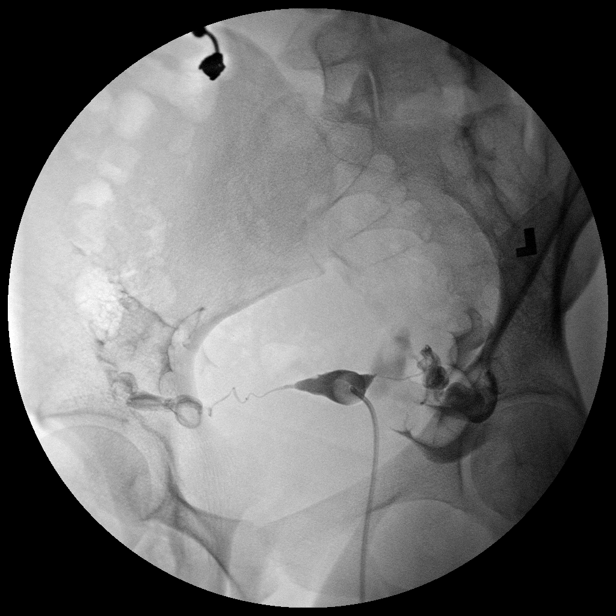

[Series 5: run · 1 of 1 slices shown (5 of 5)]
[im 1/1]
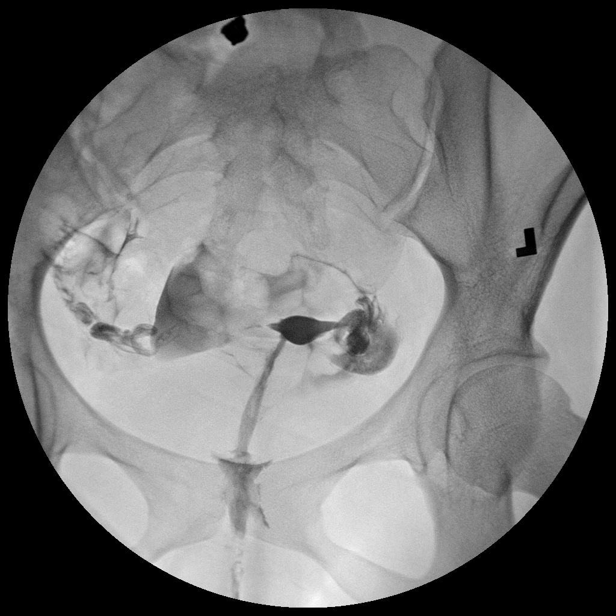

[15 of 19 positions shown; findings below may reference images not displayed]

FINDINGS: The endometrial canal is normal in contour and appearance. The
fallopian tubes are normal in appearance. Free intraperitoneal spill
is identified bilaterally.
IMPRESSION: Bilateral tubal patency.  Normal exam.

## 2017-11-30 ENCOUNTER — Encounter: Payer: Self-pay | Admitting: Family Medicine

## 2017-11-30 ENCOUNTER — Ambulatory Visit: Payer: Commercial Managed Care - PPO | Admitting: Family Medicine

## 2017-11-30 VITALS — BP 110/70 | HR 71 | Temp 98.7°F | Wt 152.6 lb

## 2017-11-30 DIAGNOSIS — Z113 Encounter for screening for infections with a predominantly sexual mode of transmission: Secondary | ICD-10-CM | POA: Diagnosis not present

## 2017-11-30 DIAGNOSIS — N898 Other specified noninflammatory disorders of vagina: Secondary | ICD-10-CM | POA: Diagnosis not present

## 2017-11-30 LAB — POCT WET PREP (WET MOUNT)
CLUE CELLS WET PREP WHIFF POC: NEGATIVE
Trichomonas Wet Prep HPF POC: ABSENT

## 2017-11-30 NOTE — Progress Notes (Signed)
   Subjective:    Patient ID: Christine Ross, female    DOB: 09/13/1990, 27 y.o.   MRN: [id number]  HPI Chief Complaint  Patient presents with  . vaginal discharge    vaginal discharge. started saturday. vaginal discharge but seems to be clearing up. not sure if its yeast infection or BV   She is here with complaints of having an abnormal discharge 2 days ago. No other symptoms. No longer having discharge but would like to be tested for STDs and BV. Reports history of STD and BV.   Last sexual encounter: 2 weeks ago.   LMP: not having periods due to implant.  Has a nexplanon   Denies fever, chills, dizziness, chest pain, palpitations, shortness of breath, abdominal pain, N/V/D, urinary symptoms, LE edema.    Review of Systems Pertinent positives and negatives in the history of present illness.     Objective:   Physical Exam  Constitutional: She appears well-developed and well-nourished. No distress.  Abdominal: Soft. Normal appearance.  Genitourinary: Vagina normal. There is no rash, tenderness or lesion on the right labia. There is no rash, tenderness or lesion on the left labia. No erythema in the vagina. No vaginal discharge found.   BP 110/70   Pulse 71   Temp 98.7 F (37.1 C) (Oral)   Wt 152 lb 9.6 oz (69.2 kg)   Breastfeeding? No   BMI 29.80 kg/m   PCOT wet prep negative or yeast, BV, trich      Assessment & Plan:  Vaginal discharge - Plan: POCT Wet Prep (Wet [place]), GC/Chlamydia Probe Amp, RPR, HIV antibody  Screen for STD (sexually transmitted disease) - Plan: POCT Wet Prep (Wet [place]), GC/Chlamydia Probe Amp, RPR, HIV antibody  Unremarkable exam and wet prep. Reassured her. Will test for GC/CT, HIV, RPR and follow up.

## 2017-12-01 LAB — HIV ANTIBODY (ROUTINE TESTING W REFLEX): HIV Screen 4th Generation wRfx: NONREACTIVE

## 2017-12-01 LAB — RPR: RPR Ser Ql: NONREACTIVE

## 2017-12-01 LAB — GC/CHLAMYDIA PROBE AMP
Chlamydia trachomatis, NAA: NEGATIVE
NEISSERIA GONORRHOEAE BY PCR: POSITIVE — AB

## 2017-12-02 ENCOUNTER — Encounter: Payer: Self-pay | Admitting: Family Medicine

## 2017-12-02 ENCOUNTER — Ambulatory Visit: Payer: Commercial Managed Care - PPO | Admitting: Family Medicine

## 2017-12-02 VITALS — BP 130/90 | HR 98 | Temp 98.2°F

## 2017-12-02 DIAGNOSIS — A549 Gonococcal infection, unspecified: Secondary | ICD-10-CM | POA: Diagnosis not present

## 2017-12-02 MED ORDER — AZITHROMYCIN 500 MG PO TABS: 1000.0000 mg | ORAL_TABLET | Freq: Every day | ORAL | 0 refills | Status: DC

## 2017-12-02 MED ORDER — CEFTRIAXONE SODIUM 250 MG IJ SOLR
250.0000 mg | Freq: Once | INTRAMUSCULAR | Status: AC
  Administered 2017-12-02: 250 mg via INTRAMUSCULAR

## 2017-12-02 NOTE — Patient Instructions (Signed)
You were given a Rocephin 250 mg injection and need to take the dose of Azithromycin. This was sent to your pharmacy. Avoid sex for at least 7 days. Make sure your partner is treated as well.    Safe Sex Practicing safe sex means taking steps before and during sex to reduce your risk of:  Getting an STD (sexually transmitted disease).  Giving your partner an STD.  Unwanted pregnancy.  How can I practice safe sex?  To practice safe sex:  Limit your sexual partners to only one partner who is having sex with only you.  Avoid using alcohol and recreational drugs before having sex. These substances can affect your judgment.  Before having sex with a new partner: ? Talk to your partner about past partners, past STDs, and drug use. ? You and your partner should be screened for STDs and discuss the results with each other.  Check your body regularly for sores, blisters, rashes, or unusual discharge. If you notice any of these problems, visit your health care provider.  If you have symptoms of an infection or you are being treated for an STD, avoid sexual contact.  While having sex, use a condom. Make sure to: ? Use a condom every time you have vaginal, oral, or anal sex. Both females and males should wear condoms during oral sex. ? Keep condoms in place from the beginning to the end of sexual activity. ? Use a latex condom, if possible. Latex condoms offer the best protection. ? Use only water-based lubricants or oils to lubricate a condom. Using petroleum-based lubricants or oils will weaken the condom and increase the chance that it will break.  See your health care provider for regular screenings, exams, and tests for STDs.  Talk with your health care provider about the form of birth control (contraception) that is best for you.  Get vaccinated against hepatitis B and human papillomavirus (HPV).  If you are at risk of being infected with HIV (human immunodeficiency virus), talk  with your health care provider about taking a prescription medicine to prevent HIV infection. You are considered at risk for HIV if: ? You are a man who has sex with other men. ? You are a heterosexual man or woman who is sexually active with more than one partner. ? You take drugs by injection. ? You are sexually active with a partner who has HIV.  This information is not intended to replace advice given to you by your health care provider. Make sure you discuss any questions you have with your health care provider. Document Released: 06/19/2004 Document Revised: 09/26/2015 Document Reviewed: 04/01/2015 Elsevier Interactive Patient Education  [employer]2018 [employer]

## 2017-12-02 NOTE — Progress Notes (Signed)
   Subjective:    Patient ID: Christine Ross, female    DOB: 12/05/1990, 27 y.o.   MRN: [id number]  HPI Chief Complaint  Patient presents with  . follow-up    follow- up on STD   She is here to discuss positive gonorrhea results and to be treated. Reports vaginal discharge now seems normal to her. No new symptoms.  She has questions regarding the different types of STIs. Reports having a history of positive trichomonas and chlamydia but denies history of gonorrhea.   Reports having only one sexual partner. Has nexplanon for contraception.   Denies fever, chills, abdominal pain, N/V/D, urinary symptoms.   Reviewed allergies, medications, past medical, surgical, family, and social history.    Review of Systems Pertinent positives and negatives in the history of present illness.     Objective:   Physical Exam BP 130/90   Pulse 98   Temp 98.2 F (36.8 C) (Oral)   SpO2 98%   Alert and oriented and in no acute distress. Not otherwise examined.       Assessment & Plan:  Gonorrhea - Plan: cefTRIAXone (ROCEPHIN) injection 250 mg  Rocephin 250 mg IM given in office. Azithromycin 1 gram prescribed per Epocrates recommendation.  Negative CT, HIV, RPR.  Counseling on avoiding sex for the next week and to have her partners get treated.  Counseling done on safe sex with condoms.  Follow up as needed.

## 2017-12-23 ENCOUNTER — Encounter: Payer: Self-pay | Admitting: Family Medicine

## 2017-12-23 ENCOUNTER — Ambulatory Visit: Payer: Commercial Managed Care - PPO | Admitting: Family Medicine

## 2017-12-23 ENCOUNTER — Telehealth: Payer: Self-pay | Admitting: Medical

## 2017-12-23 ENCOUNTER — Telehealth: Payer: Self-pay | Admitting: Family Medicine

## 2017-12-23 VITALS — BP 120/80 | HR 80 | Ht 60.0 in | Wt 156.8 lb

## 2017-12-23 DIAGNOSIS — B9689 Other specified bacterial agents as the cause of diseases classified elsewhere: Secondary | ICD-10-CM | POA: Diagnosis not present

## 2017-12-23 DIAGNOSIS — B373 Candidiasis of vulva and vagina: Secondary | ICD-10-CM

## 2017-12-23 DIAGNOSIS — N898 Other specified noninflammatory disorders of vagina: Secondary | ICD-10-CM

## 2017-12-23 DIAGNOSIS — B3731 Acute candidiasis of vulva and vagina: Secondary | ICD-10-CM

## 2017-12-23 DIAGNOSIS — N760 Acute vaginitis: Secondary | ICD-10-CM

## 2017-12-23 LAB — POCT WET PREP (WET MOUNT)
Clue Cells Wet Prep Whiff POC: POSITIVE
TRICHOMONAS WET PREP HPF POC: ABSENT

## 2017-12-23 MED ORDER — FLUCONAZOLE 150 MG PO TABS: 150.0000 mg | ORAL_TABLET | Freq: Once | ORAL | 0 refills | Status: AC

## 2017-12-23 MED ORDER — METRONIDAZOLE 500 MG PO TABS: 500.0000 mg | ORAL_TABLET | Freq: Two times a day (BID) | ORAL | 0 refills | Status: DC

## 2017-12-23 MED ORDER — FLUCONAZOLE 150 MG PO TABS: 150.0000 mg | ORAL_TABLET | Freq: Once | ORAL | 0 refills | Status: DC

## 2017-12-23 NOTE — Progress Notes (Signed)
Chief Complaint  Patient presents with  . Vaginal Discharge    saw Vickie and was diagnosed with gonorrhea. 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    Treated 7/10 with rocephin injection and azithromycin; testing was + for gonorrhea from visit 7/8.   Partner was aware of the GC diagnosis, unsure if he got treated, but she hasn't been with him since last visit. She has used a toy that she cleaned, but wonders if that could have reinfected her.  She is having recurrent discharge. Denies itching. Discharge is thin, white, slightly yellow last night, no odor. Denies pelvic pain, fever, urinary complaints.  Has nexplanon  She has had chlamydia twice, also from the same partner (her son's father)   PMH, PSH, SH reviewed  Current Outpatient Medications on File Prior to Visit  Medication Sig Dispense Refill  . etonogestrel (NEXPLANON) 68 MG IMPL implant 1 each by Subdermal route once.    Marland Kitchen. azithromycin (ZITHROMAX) 500 MG tablet Take 2 tablets (1,000 mg total) by mouth daily. (Patient not taking: Reported on 12/23/2017) 2 tablet 0   No current facility-administered medications on file prior to visit.    Allergies  Allergen Reactions  . Nickel Rash   ROS: no fever, chills, pelvic pain, urinary symptoms, GI complaints.  +vaginal discharge per HPI. No other concerns.  PHYSICAL EXAM:  BP 120/80   Pulse 80   Ht 5' (1.524 m)   Wt 156 lb 12.8 oz (71.1 kg)   BMI 30.62 kg/m   Well appearing ,pleasant female, in no distress HEENT: conjunctiva and sclera clear, EOMI Abdomen nontender, soft GU: external genitalia was notable for +thick white discharge noted on external exam, some creamier/thinner discharge also in vault. Cervix appears normal, no lesions or discharge.  Microscopic exam: +hyphae on KOH +whiff  test Many bacteria some clue cells. No trich seen  ASSESSMENT/PLAN:  Yeast vaginitis - due to recent ABX. Treat with diflucan per pt request, if symptoms don't resolve, try OTC monistat - Plan: fluconazole (DIFLUCAN) 150 MG tablet  Bacterial vaginosis - risks/side effects of ABX reviewed in detail - Plan: metroNIDAZOLE (FLAGYL) 500 MG tablet  Vaginal discharge - she had recent gonorrhea--I didn't rec presumptive retreatment given 2 other sources of discharge found today; will use today's as test of cure. - Plan: GC/Chlamydia Probe Amp, POCT Wet Prep (Wet Mount)  Counseled re: safe sex, avoiding partner who has repeatedly given her STDs   Treat for BV and yeast TOC for GC sent today Not treating again, since 2 other sources of discharge were found   Take the antibiotic twice daily for 5 days.  No alcohol while taking this, and for an extra day after (or you will get very sick). Take the diflucan pill once for yeast.  If your yeast symptoms (thick white discharge or itching) don't resolve, then try using a Monistat (over-the-counter) next week.  We are sending off the swab to ensure that the gonorrhea was completely treated (so that you don't need to return for a test of cure later, as is recommended).

## 2017-12-23 NOTE — Patient Instructions (Addendum)
Take the antibiotic twice daily for 7 days.  No alcohol while taking this, and for an extra day after (or you will get very sick). Take the diflucan pill once for yeast.  If your yeast symptoms (thick white discharge or itching) don't resolve, then try using a Monistat (over-the-counter) next week.  We are sending off the swab to ensure that the gonorrhea was completely treated (so that you don't need to return for a test of cure later, as is recommended).   Vaginal Yeast infection, Adult Vaginal yeast infection is a condition that causes soreness, swelling, and redness (inflammation) of the vagina. It also causes vaginal discharge. This is a common condition. Some women get this infection frequently. What are the causes? This condition is caused by a change in the normal balance of the yeast (candida) and bacteria that live in the vagina. This change causes an overgrowth of yeast, which causes the inflammation. What increases the risk? This condition is more likely to develop in:  Women who take antibiotic medicines.  Women who have diabetes.  Women who take birth control pills.  Women who are pregnant.  Women who douche often.  Women who have a weak defense (immune) system.  Women who have been taking steroid medicines for a long time.  Women who frequently wear tight clothing.  What are the signs or symptoms? Symptoms of this condition include:  White, thick vaginal discharge.  Swelling, itching, redness, and irritation of the vagina. The lips of the vagina (vulva) may be affected as well.  Pain or a burning feeling while urinating.  Pain during sex.  How is this diagnosed? This condition is diagnosed with a medical history and physical exam. This will include a pelvic exam. Your health care provider will examine a sample of your vaginal discharge under a microscope. Your health care provider may send this sample for testing to confirm the diagnosis. How is this  treated? This condition is treated with medicine. Medicines may be over-the-counter or prescription. You may be told to use one or more of the following:  Medicine that is taken orally.  Medicine that is applied as a cream.  Medicine that is inserted directly into the vagina (suppository).  Follow these instructions at home:  Take or apply over-the-counter and prescription medicines only as told by your health care provider.  Do not have sex until your health care provider has approved. Tell your sex partner that you have a yeast infection. That person should go to his or her health care provider if he or she develops symptoms.  Do not wear tight clothes, such as pantyhose or tight pants.  Avoid using tampons until your health care provider approves.  Eat more yogurt. This may help to keep your yeast infection from returning.  Try taking a sitz bath to help with discomfort. This is a warm water bath that is taken while you are sitting down. The water should only come up to your hips and should cover your buttocks. Do this 3-4 times per day or as told by your health care provider.  Do not douche.  Wear breathable, cotton underwear.  If you have diabetes, keep your blood sugar levels under control. Contact a health care provider if:  You have a fever.  Your symptoms go away and then return.  Your symptoms do not get better with treatment.  Your symptoms get worse.  You have new symptoms.  You develop blisters in or around your vagina.  You have  blood coming from your vagina and it is not your menstrual period.  You develop pain in your abdomen. This information is not intended to replace advice given to you by your health care provider. Make sure you discuss any questions you have with your health care provider. Document Released: 02/19/2005 Document Revised: 10/24/2015 Document Reviewed: 11/13/2014 Elsevier Interactive Patient Education  2018 [employer]  Bacterial  Vaginosis Bacterial vaginosis is a vaginal infection that occurs when the normal balance of bacteria in the vagina is disrupted. It results from an overgrowth of certain bacteria. This is the most common vaginal infection among women ages 5815-44. Because bacterial vaginosis increases your risk for STIs (sexually transmitted infections), getting treated can help reduce your risk for chlamydia, gonorrhea, herpes, and HIV (human immunodeficiency virus). Treatment is also important for preventing complications in pregnant women, because this condition can cause an early (premature) delivery. What are the causes? This condition is caused by an increase in harmful bacteria that are normally present in small amounts in the vagina. However, the reason that the condition develops is not fully understood. What increases the risk? The following factors may make you more likely to develop this condition:  Having a new sexual partner or multiple sexual partners.  Having unprotected sex.  Douching.  Having an intrauterine device (IUD).  Smoking.  Drug and alcohol abuse.  Taking certain antibiotic medicines.  Being pregnant.  You cannot get bacterial vaginosis from toilet seats, bedding, swimming pools, or contact with objects around you. What are the signs or symptoms? Symptoms of this condition include:  Grey or white vaginal discharge. The discharge can also be watery or foamy.  A fish-like odor with discharge, especially after sexual intercourse or during menstruation.  Itching in and around the vagina.  Burning or pain with urination.  Some women with bacterial vaginosis have no signs or symptoms. How is this diagnosed? This condition is diagnosed based on:  Your medical history.  A physical exam of the vagina.  Testing a sample of vaginal fluid under a microscope to look for a large amount of bad bacteria or abnormal cells. Your health care provider may use a cotton swab or a small  wooden spatula to collect the sample.  How is this treated? This condition is treated with antibiotics. These may be given as a pill, a vaginal cream, or a medicine that is put into the vagina (suppository). If the condition comes back after treatment, a second round of antibiotics may be needed. Follow these instructions at home: Medicines  Take over-the-counter and prescription medicines only as told by your health care provider.  Take or use your antibiotic as told by your health care provider. Do not stop taking or using the antibiotic even if you start to feel better. General instructions  If you have a female sexual partner, tell her that you have a vaginal infection. She should see her health care provider and be treated if she has symptoms. If you have a female sexual partner, he does not need treatment.  During treatment: ? Avoid sexual activity until you finish treatment. ? Do not douche. ? Avoid alcohol as directed by your health care provider. ? Avoid breastfeeding as directed by your health care provider.  Drink enough water and fluids to keep your urine clear or pale yellow.  Keep the area around your vagina and rectum clean. ? Wash the area daily with warm water. ? Wipe yourself from front to back after using the toilet.  Keep all follow-up visits as told by your health care provider. This is important. How is this prevented?  Do not douche.  Wash the outside of your vagina with warm water only.  Use protection when having sex. This includes latex condoms and dental dams.  Limit how many sexual partners you have. To help prevent bacterial vaginosis, it is best to have sex with just one partner (monogamous).  Make sure you and your sexual partner are tested for STIs.  Wear cotton or cotton-lined underwear.  Avoid wearing tight pants and pantyhose, especially during summer.  Limit the amount of alcohol that you drink.  Do not use any products that contain  nicotine or tobacco, such as cigarettes and e-cigarettes. If you need help quitting, ask your health care provider.  Do not use illegal drugs. Where to find more information:  Centers for Disease Control and Prevention: SolutionApps.co.za  American Sexual Health Association (ASHA): www.ashastd.org  U.S. Department of Health and Health and safety inspector, Office on Women's Health: ConventionalMedicines.si or http://www.anderson-williamson.info/ Contact a health care provider if:  Your symptoms do not improve, even after treatment.  You have more discharge or pain when urinating.  You have a fever.  You have pain in your abdomen.  You have pain during sex.  You have vaginal bleeding between periods. Summary  Bacterial vaginosis is a vaginal infection that occurs when the normal balance of bacteria in the vagina is disrupted.  Because bacterial vaginosis increases your risk for STIs (sexually transmitted infections), getting treated can help reduce your risk for chlamydia, gonorrhea, herpes, and HIV (human immunodeficiency virus). Treatment is also important for preventing complications in pregnant women, because the condition can cause an early (premature) delivery.  This condition is treated with antibiotic medicines. These may be given as a pill, a vaginal cream, or a medicine that is put into the vagina (suppository). This information is not intended to replace advice given to you by your health care provider. Make sure you discuss any questions you have with your health care provider. Document Released: 05/12/2005 Document Revised: 09/15/2016 Document Reviewed: 01/26/2016 Elsevier Interactive Patient Education  [employer].

## 2017-12-23 NOTE — Telephone Encounter (Signed)
She should be seen again.

## 2017-12-23 NOTE — Telephone Encounter (Signed)
Pt is having same vaginal discharge that she had before starting med for STD treatment. No odor, no burning, no itching. She has not had sex since starting antibiotic. Does she need more med? Pt is in orientation for job over the next couple of days so has very limited availability to come in for appointment because she can not miss the orientation. She only wants to see Vickie or Dr. Lynelle DoctorKnapp.

## 2017-12-23 NOTE — Telephone Encounter (Signed)
Pt made appointment.

## 2017-12-23 NOTE — Telephone Encounter (Signed)
Pt called and would like her RX Diflucan and Flagyl resent to the [id number] - [place], [place] - [address]

## 2017-12-23 NOTE — Telephone Encounter (Signed)
Done

## 2017-12-25 LAB — GC/CHLAMYDIA PROBE AMP
CHLAMYDIA, DNA PROBE: NEGATIVE
Neisseria gonorrhoeae by PCR: NEGATIVE

## 2018-10-04 ENCOUNTER — Telehealth: Payer: Self-pay | Admitting: Medical

## 2018-10-04 NOTE — Telephone Encounter (Signed)
Set up virtual visit please, or in person if needed.  Depends upon symptoms and if desire for other testing.

## 2018-10-04 NOTE — Telephone Encounter (Signed)
Patient scheduled appointment for tomorrow.

## 2018-10-04 NOTE — Telephone Encounter (Signed)
Pt called and states that she was just told by her boyfriend he tested positive for gonorrhea. She would like medication. Pt can be reached at [phone].

## 2018-10-05 ENCOUNTER — Encounter: Payer: Self-pay | Admitting: Medical

## 2018-10-05 ENCOUNTER — Other Ambulatory Visit: Payer: Self-pay

## 2018-10-05 ENCOUNTER — Ambulatory Visit (INDEPENDENT_AMBULATORY_CARE_PROVIDER_SITE_OTHER): Payer: BC Managed Care – PPO | Admitting: Medical

## 2018-10-05 VITALS — BP 120/70 | HR 76 | Temp 98.2°F | Resp 16 | Ht 60.0 in | Wt 158.8 lb

## 2018-10-05 DIAGNOSIS — Z202 Contact with and (suspected) exposure to infections with a predominantly sexual mode of transmission: Secondary | ICD-10-CM

## 2018-10-05 DIAGNOSIS — Z113 Encounter for screening for infections with a predominantly sexual mode of transmission: Secondary | ICD-10-CM

## 2018-10-05 DIAGNOSIS — A599 Trichomoniasis, unspecified: Secondary | ICD-10-CM | POA: Insufficient documentation

## 2018-10-05 DIAGNOSIS — N898 Other specified noninflammatory disorders of vagina: Secondary | ICD-10-CM | POA: Diagnosis not present

## 2018-10-05 DIAGNOSIS — R300 Dysuria: Secondary | ICD-10-CM | POA: Insufficient documentation

## 2018-10-05 LAB — POCT URINALYSIS DIP (PROADVANTAGE DEVICE)
Bilirubin, UA: NEGATIVE
Glucose, UA: NEGATIVE mg/dL
Ketones, POC UA: NEGATIVE mg/dL
Leukocytes, UA: NEGATIVE
Nitrite, UA: NEGATIVE
Specific Gravity, Urine: 1.02
Urobilinogen, Ur: NEGATIVE
pH, UA: 6 (ref 5.0–8.0)

## 2018-10-05 LAB — POCT URINE PREGNANCY: Preg Test, Ur: NEGATIVE

## 2018-10-05 LAB — POCT WET PREP (WET MOUNT): WBC, Wet Prep HPF POC: NORMAL

## 2018-10-05 MED ORDER — CEFTRIAXONE SODIUM 250 MG IJ SOLR
250.0000 mg | Freq: Once | INTRAMUSCULAR | Status: AC
  Administered 2018-10-05: 14:00:00 250 mg via INTRAMUSCULAR

## 2018-10-05 MED ORDER — METRONIDAZOLE 500 MG PO TABS
ORAL_TABLET | ORAL | 0 refills | Status: DC
Start: 2018-10-05 — End: 2020-07-01

## 2018-10-05 NOTE — Progress Notes (Signed)
Subjective: Chief Complaint  Patient presents with  . STD    STD testing preg test   Here for concern for STD testing.  Boyfriend called her yesterday, and he reported he was feeling funny.   He went to the doctor yesterday, reportedly testing + for gonorrhea.   They have been dating 3 months.   Both had partners before each other.  Hasn't been using condoms as she seems to get a yeast infections when she uses condoms.  Prior to this boyfriend had not been sexually active for months.    No current symptoms.    She notes hx/o chlamydia in 2016 with baby's father, had pelvic pain with that.   Had second STD with baby's father in past, was diagnosed with trichomonas.  Last year had gonorrhea with baby's father last year.  She finally quit messing with him them.    LMP - spotting now.   LMP prior to this one was 07/2018.  Nexplanon was placed 08/2016.    No other aggravating or relieving factors. No other complaint.  Past Medical History:  Diagnosis Date  . Chlamydia contact, treated 02-2014  . Chlamydial cervicitis 02/28/2014   Treated.   . Depression    age 28yo, resolved  . Eczema   . GC (gonococcus) 02/28/2014   Sexual contact.  Negative culture, per patient.  Treated for contact.   . Gonorrhea   . Insomnia   . Routine gynecological examination    Dr. Jean Rosenthal  . Wears glasses    Current Outpatient Medications on File Prior to Visit  Medication Sig Dispense Refill  . etonogestrel (NEXPLANON) 68 MG IMPL implant 1 each by Subdermal route once.     No current facility-administered medications on file prior to visit.      Objective: BP 120/70   Pulse 76   Temp 98.2 F (36.8 C) (Oral)   Resp 16   Ht 5' (1.524 m)   Wt 158 lb 12.8 oz (72 kg)   BMI 31.01 kg/m   Gen: wd, wn, nad Gyn: Normal external genitalia without lesions, vagina with normal mucosa, cervix without lesions, no cervical motion tenderness, mild bleeding from menstrual period, otherwise no abnormal vaginal  discharge.  Uterus and adnexa not enlarged, nontender, no masses.  Exam chaperoned by nurse. Rectal: anus normal appearing   Assessment: Encounter Diagnoses  Name Primary?  . Dysuria Yes  . Venereal disease contact   . Vaginal discharge   . Screen for STD (sexually transmitted disease)   . Trichomoniasis     Plan: Discussed concerns.  Gave 250mg  Rocephin IM today for empiric therapy for gonorrhea.   Will begin Metronidazole for + trichomonas on swab.   Testing today as below.  Discussed safe sex, condom use, trying other types of condoms, other brands to fine one that does seem to aggravate her, and discussed repeat testing periodically.     explanation in place for contraception.   Kayda was seen today for std.  Diagnoses and all orders for this visit:  Dysuria -     POCT Urinalysis DIP (Proadvantage Device) -     POCT urine pregnancy  Venereal disease contact -     RPR -     HSV 1 antibody, IgG -     HSV 2 antibody, IgG -     HSV(herpes simplex vrs) 1+2 ab-IgM -     POCT Wet Prep (Wet Mount) -     GC/Chlamydia Probe Amp -  HIV Antibody (routine testing w rflx)  Vaginal discharge -     POCT Wet Prep ([place]et [place]) -     GC/Chlamydia Probe Amp  Screen for STD (sexually transmitted disease) -     RPR -     HSV 1 antibody, IgG -     HSV 2 antibody, IgG -     HSV(herpes simplex vrs) 1+2 ab-IgM -     POCT Wet Prep (Wet [place]) -     GC/Chlamydia Probe Amp -     HIV Antibody (routine testing w rflx)  Trichomoniasis  Other orders -     metroNIDAZOLE (FLAGYL) 500 MG tablet; 4 tablets once, don't consume alcohol while taking the medication

## 2018-10-06 LAB — HSV 1 ANTIBODY, IGG: HSV 1 Glycoprotein G Ab, IgG: <0.91 index (ref 0.00–0.90)

## 2018-10-06 LAB — HIV ANTIBODY (ROUTINE TESTING W REFLEX): HIV Screen 4th Generation wRfx: NONREACTIVE

## 2018-10-06 LAB — HSV(HERPES SIMPLEX VRS) I + II AB-IGM: HSVI/II Comb IgM: 1.82 Ratio — ABNORMAL HIGH (ref 0.00–0.90)

## 2018-10-06 LAB — HSV 2 ANTIBODY, IGG: HSV 2 IgG, Type Spec: <0.91 index (ref 0.00–0.90)

## 2018-10-06 LAB — RPR: RPR Ser Ql: NONREACTIVE

## 2018-10-08 LAB — GC/CHLAMYDIA PROBE AMP
Chlamydia trachomatis, NAA: NEGATIVE
Neisseria Gonorrhoeae by PCR: POSITIVE — AB

## 2019-06-14 ENCOUNTER — Other Ambulatory Visit: Payer: Self-pay | Admitting: Cardiology

## 2019-06-14 DIAGNOSIS — Z20822 Contact with and (suspected) exposure to covid-19: Secondary | ICD-10-CM

## 2019-06-15 LAB — NOVEL CORONAVIRUS, NAA: SARS-CoV-2, NAA: NOT DETECTED

## 2019-06-16 ENCOUNTER — Telehealth: Payer: Self-pay | Admitting: General Practice

## 2019-06-16 NOTE — Telephone Encounter (Signed)
Gave patient negative covid test results Patient understood 

## 2019-12-22 LAB — HM PAP SMEAR: HM Pap smear: NEGATIVE

## 2020-01-04 ENCOUNTER — Ambulatory Visit
Admission: EM | Admit: 2020-01-04 | Discharge: 2020-01-04 | Disposition: A | Discharge status: Home / Self Care | Payer: BC Managed Care – PPO | Attending: Physician Assistant | Admitting: Physician Assistant

## 2020-01-04 DIAGNOSIS — Z1152 Encounter for screening for COVID-19: Secondary | ICD-10-CM

## 2020-01-04 DIAGNOSIS — Z20822 Contact with and (suspected) exposure to covid-19: Secondary | ICD-10-CM

## 2020-01-04 NOTE — ED Provider Notes (Signed)
[facility]    CSN: [id number] Arrival date & time: 01/04/20  1942      History   Chief Complaint Chief Complaint  Patient presents with  . covid positive    HPI Christine Ross is a 29 y.o. female.   29 year old female comes in for 3 day of URI symptoms. Headache, sneezing, loss of appetite. Denies fever, chills, body aches. Denies abdominal pain, nausea, vomiting, diarrhea. Denies shortness of breath, loss of taste/smell. Did at home COVID testing, which was positive. 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 Son also positive for COVID from at home testing.      Past Medical History:  Diagnosis Date  . Chlamydia contact, treated 02-2014  . Chlamydial cervicitis 02/28/2014   Treated.   . Depression    age 74yo, resolved  . Eczema   . GC (gonococcus) 02/28/2014   Sexual contact.  Negative culture, per patient.  Treated for contact.   . Gonorrhea   . Insomnia   . Routine gynecological examination    Dr. Jean Rosenthal  . Wears glasses     Patient Active Problem List   Diagnosis Date Noted  . Screen for STD (sexually transmitted disease) 10/05/2018  . Vaginal discharge 10/05/2018  . Venereal disease contact 10/05/2018  . Dysuria 10/05/2018  . Trichomoniasis 10/05/2018  . Gonorrhea   . Vaginal delivery 11/25/2015  . Full-term PROM with onset of labor within 24 hours of rupture 11/24/2015  . Chlamydial cervicitis 02/28/2014  . GC (gonococcus) 02/28/2014    Past Surgical History:  Procedure Laterality Date  . DILATION AND EVACUATION N/A 06/29/2014   Procedure: DILATATION AND EVACUATION;  Surgeon: Brock Bad, MD;  Location: [facility];  Service: Gynecology;  Laterality: N/A;  . DILATION AND EVACUATION N/A 08/16/2014   Procedure: DILATATION AND EVACUATION;  Surgeon: Maxie Better, MD;  Location: [facility];  Service: Gynecology;  Laterality: N/A;  . OPERATIVE ULTRASOUND N/A 08/16/2014   Procedure: OPERATIVE ULTRASOUND;  Surgeon: Maxie Better,  MD;  Location: [facility];  Service: Gynecology;  Laterality: N/A;  . WISDOM TOOTH EXTRACTION      OB History    Gravida  3   Para  1   Term  1   Preterm  0   AB  2   Living  1     SAB  2   TAB  0   Ectopic  0   Multiple  0   Live Births  1            Home Medications    Prior to Admission medications   Medication Sig Start Date End Date Taking? Authorizing Provider  metroNIDAZOLE (FLAGYL) 500 MG tablet 4 tablets once, don't consume alcohol while taking the medication 10/05/18   Tysinger, Kermit Balo, PA-C    Family History Family History  Problem Relation Age of Onset  . Diabetes Mother   . Diabetes Maternal Grandmother   . Diabetes Paternal Grandmother     Social History Social History   Tobacco Use  . Smoking status: Current Every Day Smoker    Types: Cigars    Last attempt to quit: 02/24/2015    Years since quitting: 4.8  . Smokeless tobacco: Never Used  Substance Use Topics  . Alcohol use: No    Alcohol/week: 0.0 standard drinks    Comment: social  . Drug use: No     Allergies   Nickel   Review of Systems Review of Systems  Reason  unable to perform ROS: See HPI as above.     Physical Exam Triage Vital Signs ED Triage Vitals  Enc Vitals Group     BP 01/04/20 1957 126/80     Pulse Rate 01/04/20 1957 89     Resp 01/04/20 1957 18     Temp 01/04/20 1957 98.1 F (36.7 C)     Temp Source 01/04/20 1957 Oral     SpO2 01/04/20 1957 98 %     Weight --      Height --      Head Circumference --      Peak Flow --      Pain Score 01/04/20 1954 5     Pain Loc --      Pain Edu? --      Excl. in GC? --    No data found.  Updated Vital Signs BP 126/80 (BP Location: Left Arm)   Pulse 89   Temp 98.1 F (36.7 C) (Oral)   Resp 18   LMP 12/26/2019   SpO2 98%   Physical Exam Constitutional:      General: She is not in acute distress.    Appearance: Normal appearance. She is not ill-appearing, toxic-appearing or diaphoretic.  HENT:      Head: Normocephalic and atraumatic.     Mouth/Throat:     Mouth: Mucous membranes are moist.     Pharynx: Oropharynx is clear. Uvula midline.  Cardiovascular:     Rate and Rhythm: Normal rate and regular rhythm.     Heart sounds: Normal heart sounds. No murmur heard.  No friction rub. No gallop.   Pulmonary:     Effort: Pulmonary effort is normal. No accessory muscle usage, prolonged expiration, respiratory distress or retractions.     Comments: Lungs clear to auscultation without adventitious lung sounds. Musculoskeletal:     Cervical back: Normal range of motion and neck supple.  Neurological:     General: No focal deficit present.     Mental Status: She is alert and oriented to person, place, and time.      UC Treatments / Results  Labs (all labs ordered are listed, but only abnormal results are displayed) Labs Reviewed  NOVEL CORONAVIRUS, NAA    EKG   Radiology No results found.  Procedures Procedures (including critical care time)  Medications Ordered in UC Medications - No data to display  Initial Impression / Assessment and Plan / UC Course  I have reviewed the triage vital signs and the nursing notes.  Pertinent labs & imaging results that were available during my care of the patient were reviewed by me and considered in my medical decision making (see chart for details).    COVID PCR test ordered. Patient to quarantine until testing results return. No alarming signs on exam.  Patient speaking in full sentences without respiratory distress.  Symptomatic treatment discussed.  Push fluids.  Return precautions given.  Patient expresses understanding and agrees to plan.  Final Clinical Impressions(s) / UC Diagnoses   Final diagnoses:  Encounter for screening for COVID-19  Suspected COVID-19 virus infection    ED Prescriptions    None     PDMP not reviewed this encounter.   Belinda Fisher, PA-C 01/04/20 2048

## 2020-01-04 NOTE — ED Triage Notes (Signed)
Pt states she has HA, sneezing for past three days. Pt took home COVID test today and it was positive. Pt states her son had fever last night and he slept all day, so pt tested son and herself and both were positive.  Denies URI symptoms, n/v/d, fever, chills, abdominal pain. Pt states she will require work for employer.

## 2020-01-04 NOTE — Discharge Instructions (Signed)
COVID PCR testing ordered. I would like you to quarantine until testing results. You can take over the counter flonase/nasacort to help with nasal congestion/drainage. Tylenol/motrin for pain and fever. Keep hydrated, urine should be clear to pale yellow in color. If experiencing shortness of breath, trouble breathing, go to the emergency department for further evaluation needed.  

## 2020-01-07 LAB — NOVEL CORONAVIRUS, NAA: SARS-CoV-2, NAA: NOT DETECTED

## 2020-02-24 ENCOUNTER — Encounter: Payer: Self-pay | Admitting: Family Medicine

## 2020-02-24 ENCOUNTER — Ambulatory Visit (INDEPENDENT_AMBULATORY_CARE_PROVIDER_SITE_OTHER): Payer: BC Managed Care – PPO | Admitting: Family Medicine

## 2020-02-24 VITALS — BP 120/80 | HR 111 | Ht 60.0 in | Wt 138.2 lb

## 2020-02-24 DIAGNOSIS — L309 Dermatitis, unspecified: Secondary | ICD-10-CM

## 2020-02-24 DIAGNOSIS — F419 Anxiety disorder, unspecified: Secondary | ICD-10-CM | POA: Diagnosis not present

## 2020-02-24 DIAGNOSIS — F439 Reaction to severe stress, unspecified: Secondary | ICD-10-CM | POA: Diagnosis not present

## 2020-02-24 DIAGNOSIS — F32A Depression, unspecified: Secondary | ICD-10-CM | POA: Diagnosis not present

## 2020-02-24 MED ORDER — TRIAMCINOLONE ACETONIDE 0.1 % EX CREA: 1.0000 "application " | TOPICAL_CREAM | Freq: Two times a day (BID) | CUTANEOUS | 0 refills | Status: DC

## 2020-02-24 MED ORDER — ALPRAZOLAM 0.25 MG PO TABS: 0.2500 mg | ORAL_TABLET | Freq: Two times a day (BID) | ORAL | 0 refills | Status: DC | PRN

## 2020-02-24 MED ORDER — ESCITALOPRAM OXALATE 10 MG PO TABS: 10.0000 mg | ORAL_TABLET | Freq: Every day | ORAL | 1 refills | Status: DC

## 2020-02-24 NOTE — Progress Notes (Signed)
Subjective:    Patient ID: Christine Ross, female    DOB: 1991/02/26, 29 y.o.   MRN: [id number]  HPI Chief Complaint  Patient presents with   Anxiety   Panic Attack   Eczema    neck,left arm and right wrist    She is here with complaints of worsening anxiety.  She is tearful.  Reports having issues with anxiety since high school.  States she used to have night terrors.  States she was on Prozac in high school but it made her feel like a zombie so she stopped it.  Denies being on medication for anxiety since then. States she used to have a Veterinary surgeon up until June 2020.  States she has been having panic attacks.  She has issues with her heart pounding and not been able to catch her breath.  States triggers are home life and stress at work.  She is a Runner, broadcasting/film/video.  Denies any thoughts of SI or HI.  Her son is with her today who is 86 years old.  She states he keeps her going.  He is playful and seems happy.  Reports having a history of ADHD.   States she is somewhat self medicating by smoking marijuana occasionally.  States this is the only weigh she can eat because her appetite is poor.  Reports having eczema flareups on her neck, antecubital areas and wrists couple of weeks ago but she has been using lotions which has improved the area significantly.  Requests steroid prescription to use as needed.    Depression screen PHQ 2/9 02/24/2020  Decreased Interest 3  Down, Depressed, Hopeless 3  PHQ - 2 Score 6  Altered sleeping 1  Tired, decreased energy 3  Change in appetite 1  Feeling bad or failure about yourself  1  Trouble concentrating 1  Moving slowly or fidgety/restless 0  Suicidal thoughts 0  PHQ-9 Score 13  Difficult doing work/chores Extremely dIfficult   Reviewed allergies, medications, past medical, surgical, family, and social history.    Review of Systems Pertinent positives and negatives in the history of present illness.     Objective:   Physical Exam BP  120/80    Pulse (!) 111    Ht 5' (1.524 m)    Wt 138 lb 3.2 oz (62.7 kg)    SpO2 95%    BMI 26.99 kg/m   Alert and oriented and in no acute distress.  She is tearful.  Normal speech and thought process.      Assessment & Plan:  Anxiety and depression - Plan: escitalopram (LEXAPRO) 10 MG tablet, ALPRAZolam (XANAX) 0.25 MG tablet  Eczema, unspecified type - Plan: triamcinolone cream (KENALOG) 0.1 %  Stress  No red flag symptoms.  No thoughts of self-harm. Discussed starting her on Lexapro and she will take 1/2 tablet for the first week.  Discussed potential side effects.  She may start on the full tablet as of week 2 as long as she is doing well.  I will also prescribe alprazolam for her to take as needed for panic attacks while she is adjusting to the medication.  Advised her to avoid any mind altering substances including marijuana with this medication.  Advised that this medication may be sedating.  Discussed that alprazolam is meant for short-term use only. She will call and schedule with her counselor. Recommend she follow-up in 2 weeks with her PCP, Christine Ross. I will prescribe her triamcinolone for her eczema which appears to be doing fine  today.

## 2020-02-24 NOTE — Patient Instructions (Addendum)
Start taking 1/2 tablet of the Lexapro for the first week. As long as you are doing well, increase to the full tablet week 2.   Use the alprazolam as needed and use caution, it may sedate you.

## 2020-02-26 ENCOUNTER — Encounter: Payer: Self-pay | Admitting: Family Medicine

## 2020-03-07 ENCOUNTER — Other Ambulatory Visit: Payer: Self-pay

## 2020-03-07 ENCOUNTER — Telehealth: Payer: BC Managed Care – PPO | Admitting: Medical

## 2020-03-07 NOTE — Progress Notes (Signed)
error 

## 2020-05-26 NOTE — L&D Delivery Note (Signed)
Operative Delivery Note  Patient progressed rapidly from 5 cm to C/C/+1. While pushing for less than 10 minutes there was fetal bradycardia noted nadir in the 70's that was persistent and not returning to baseline or responding to scalp stimulation.  Discussed with patient the need for immediate operative delivery with a vacuum. Peds called to delivery.   Verbal consent: obtained from patient.  Risks and benefits discussed in detail.  Risks include, but are not limited to the risks of anesthesia, bleeding, infection, damage to maternal tissues, fetal cephalhematoma.  There is also the risk of inability to effect vaginal delivery of the head, or shoulder dystocia that cannot be resolved by established maneuvers, leading to the need for emergency cesarean section.    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 Bladder was just previously emptied by removal of the indwelling foley catheter. Epidural was found to be adequate.  Vertex was +3 station.   The Kiwi vacuum was placed.  With maternal effort and 1 pull, the head was delivered. Position: Right, Occiput, Anterior.  After the delivery of the head, the anterior shoulder did not immediately deliver with gentle downward traction and a shoulder dystocia was called.    Simultaneous McRoberts and Suprapubic pressure was applied by the assisting nursing staff and attempt was made to deliver the posterior arm unsuccessful. The shoulder was then released with [place] screw maneuver. The total time of the shoulder dystocia was approximately 45 seconds. Once the anterior shoulder was delivered the rest of the body delivered easily. At 2:43 PM a viable and healthy female was delivered via Vaginal, Spontaneous.  Presentation: vertex; Position: Right,, Occiput,, Anterior; Station: +4.  The baby was placed on the maternal abdomen. The cord was quickly cut and the baby handed off to the waiting NICU team. The baby was noted to have vigorous cry and was moving all four extremities  well.  Cord gases (venous) obtained. Cord blood was then obtained. The placenta spontaneously delivered, intact, and 3 vessels noted.  There was no uterine atony, as the uterus was firm with IV pitocin. Vaginal sweep done.   There were no cervical, vaginal or perineal lacerations. The patient tolerated the delivery well. There were no complications.     Delivery of the head: 02/18/2021  2:42 PM First maneuver: 02/18/2021  2:42 PM, McRoberts Second maneuver: 02/18/2021  2:42 PM, Suprapubic Pressure Third maneuver: 02/18/2021  2:42 PM,  Joseph Art Screw  Baby Girl, "Reigan" APGAR: 8, 9; weight: pending  Placenta status: spontaneous, intact Cord: 3 vessels Complications: None  Cord pH: pending  Anesthesia:  Epidural Instruments: Kiwi vacuum Episiotomy: None Lacerations: None Suture Repair: N/A Est. Blood Loss (mL): 50  Mom to postpartum.  Baby to Couplet care / Skin to Skin.  Naoma Diener Romin Divita 02/18/2021, 3:36 PM

## 2020-06-30 ENCOUNTER — Other Ambulatory Visit: Payer: Self-pay

## 2020-06-30 ENCOUNTER — Inpatient Hospital Stay (HOSPITAL_COMMUNITY)
Admission: AD | Admit: 2020-06-30 | Discharge: 2020-07-01 | Disposition: A | Discharge status: Home / Self Care | Payer: BC Managed Care – PPO | Attending: Obstetrics and Gynecology | Admitting: Obstetrics and Gynecology

## 2020-06-30 ENCOUNTER — Encounter (HOSPITAL_COMMUNITY): Payer: Self-pay | Admitting: Obstetrics and Gynecology

## 2020-06-30 DIAGNOSIS — O210 Mild hyperemesis gravidarum: Secondary | ICD-10-CM

## 2020-06-30 DIAGNOSIS — O3680X0 Pregnancy with inconclusive fetal viability, not applicable or unspecified: Secondary | ICD-10-CM

## 2020-06-30 DIAGNOSIS — O99891 Other specified diseases and conditions complicating pregnancy: Secondary | ICD-10-CM | POA: Insufficient documentation

## 2020-06-30 DIAGNOSIS — F32A Depression, unspecified: Secondary | ICD-10-CM | POA: Insufficient documentation

## 2020-06-30 DIAGNOSIS — R6883 Chills (without fever): Secondary | ICD-10-CM

## 2020-06-30 DIAGNOSIS — R197 Diarrhea, unspecified: Secondary | ICD-10-CM | POA: Diagnosis not present

## 2020-06-30 DIAGNOSIS — O26891 Other specified pregnancy related conditions, first trimester: Secondary | ICD-10-CM

## 2020-06-30 DIAGNOSIS — F419 Anxiety disorder, unspecified: Secondary | ICD-10-CM | POA: Diagnosis not present

## 2020-06-30 DIAGNOSIS — O26899 Other specified pregnancy related conditions, unspecified trimester: Secondary | ICD-10-CM

## 2020-06-30 DIAGNOSIS — Z3491 Encounter for supervision of normal pregnancy, unspecified, first trimester: Secondary | ICD-10-CM

## 2020-06-30 DIAGNOSIS — O99341 Other mental disorders complicating pregnancy, first trimester: Secondary | ICD-10-CM | POA: Diagnosis not present

## 2020-06-30 DIAGNOSIS — Z3A01 Less than 8 weeks gestation of pregnancy: Secondary | ICD-10-CM | POA: Diagnosis not present

## 2020-06-30 DIAGNOSIS — O219 Vomiting of pregnancy, unspecified: Secondary | ICD-10-CM

## 2020-06-30 DIAGNOSIS — Z20822 Contact with and (suspected) exposure to covid-19: Secondary | ICD-10-CM | POA: Diagnosis not present

## 2020-06-30 HISTORY — DX: Anxiety disorder, unspecified: F41.9

## 2020-06-30 LAB — URINALYSIS, ROUTINE W REFLEX MICROSCOPIC
Bilirubin Urine: NEGATIVE
Glucose, UA: NEGATIVE mg/dL
Hgb urine dipstick: NEGATIVE
Ketones, ur: 20 mg/dL — AB
Leukocytes,Ua: NEGATIVE
Nitrite: NEGATIVE
Protein, ur: NEGATIVE mg/dL
Specific Gravity, Urine: 1.015 (ref 1.005–1.030)
pH: 7 (ref 5.0–8.0)

## 2020-06-30 LAB — POCT PREGNANCY, URINE: Preg Test, Ur: POSITIVE — AB

## 2020-06-30 MED ORDER — LACTATED RINGERS IV BOLUS
1000.0000 mL | Freq: Once | INTRAVENOUS | Status: AC
  Administered 2020-07-01: 1000 mL via INTRAVENOUS

## 2020-06-30 MED ORDER — PROMETHAZINE HCL 25 MG/ML IJ SOLN
12.5000 mg | Freq: Once | INTRAMUSCULAR | Status: AC
  Administered 2020-07-01: 12.5 mg via INTRAVENOUS
  Filled 2020-06-30: qty 1

## 2020-06-30 NOTE — MAU Note (Signed)
Pt here tonight with c/o chills that started around 2030.  Pt states that she has been having N/V since finding out she was pregnant but became concerned when the chills started.  Pt is using seabands for N/V.  Pt reports LMP 05/14/20 EDD 02/18/21 and 6w 3d today.

## 2020-07-01 ENCOUNTER — Inpatient Hospital Stay (HOSPITAL_COMMUNITY): Payer: BC Managed Care – PPO

## 2020-07-01 LAB — COMPREHENSIVE METABOLIC PANEL
ALT: 17 U/L (ref 0–44)
AST: 18 U/L (ref 15–41)
Albumin: 3.9 g/dL (ref 3.5–5.0)
Alkaline Phosphatase: 35 U/L — ABNORMAL LOW (ref 38–126)
Anion gap: 10 (ref 5–15)
BUN: 6 mg/dL (ref 6–20)
CO2: 23 mmol/L (ref 22–32)
Calcium: 9.2 mg/dL (ref 8.9–10.3)
Chloride: 102 mmol/L (ref 98–111)
Creatinine, Ser: 0.56 mg/dL (ref 0.44–1.00)
GFR, Estimated: >60 mL/min (ref >60)
Glucose, Bld: 85 mg/dL (ref 70–99)
Potassium: 3.6 mmol/L (ref 3.5–5.1)
Sodium: 135 mmol/L (ref 135–145)
Total Bilirubin: 1.6 mg/dL — ABNORMAL HIGH (ref 0.3–1.2)
Total Protein: 6.7 g/dL (ref 6.5–8.1)

## 2020-07-01 LAB — RESP PANEL BY RT-PCR (FLU A&B, COVID) ARPGX2
Influenza A by PCR: NEGATIVE
Influenza B by PCR: NEGATIVE
SARS Coronavirus 2 by RT PCR: NEGATIVE

## 2020-07-01 LAB — CBC
HCT: 37.4 % (ref 36.0–46.0)
Hemoglobin: 13 g/dL (ref 12.0–15.0)
MCH: 30.8 pg (ref 26.0–34.0)
MCHC: 34.8 g/dL (ref 30.0–36.0)
MCV: 88.6 fL (ref 80.0–100.0)
Platelets: 202 10*3/uL (ref 150–400)
RBC: 4.22 MIL/uL (ref 3.87–5.11)
RDW: 11.8 % (ref 11.5–15.5)
WBC: 6.3 10*3/uL (ref 4.0–10.5)
nRBC: 0 % (ref 0.0–0.2)

## 2020-07-01 LAB — HCG, QUANTITATIVE, PREGNANCY: hCG, Beta Chain, Quant, S: 70261 m[IU]/mL — ABNORMAL HIGH (ref <5)

## 2020-07-01 LAB — ABO/RH: ABO/RH(D): O POS

## 2020-07-01 LAB — SARS CORONAVIRUS 2 BY RT PCR (HOSPITAL ORDER, PERFORMED IN ~~LOC~~ HOSPITAL LAB): SARS Coronavirus 2: NEGATIVE

## 2020-07-01 MED ORDER — ONDANSETRON 4 MG PO TBDP: 4.0000 mg | ORAL_TABLET | Freq: Three times a day (TID) | ORAL | 0 refills | Status: DC | PRN

## 2020-07-01 NOTE — MAU Note (Signed)
Ultrasound completed-pt up to bathroom-states she urinated a large amount-vomited yellow emesis-615ml LR infused

## 2020-07-01 NOTE — MAU Provider Note (Signed)
History     CSN: [id number]  Arrival date and time: 06/30/20 2207   Event Date/Time   First Provider Initiated Contact with Patient 06/30/20 2343      Chief Complaint  Patient presents with  . Chills  . Emesis   Christine Ross is a 30 y.o. G4P1 at [redacted]w[redacted]d by LMP who presents to MAU with complaints of emesis, diarrhea and chills. Patient reports that she has been having emesis since finding out she was pregnant. Usually emesis only occurs in the morning but reports that today has been occurring more frequently. Reports taking seabands for nausea/vomiting and denies any other medication. Patient reports diarrhea started occurring last night. Reports 3 occurrences of diarrhea today - patient also reports that son came home with diarrhea today as well. She reports that around 2000 started having chills, denies fever. 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 She denies abdominal pain, vaginal bleeding or discharge.    OB History    Gravida  4   Para  1   Term  1   Preterm  0   AB  2   Living  1     SAB  2   IAB  0   Ectopic  0   Multiple  0   Live Births  1           Past Medical History:  Diagnosis Date  . Anxiety   . Chlamydia contact, treated 02-2014  . Chlamydial cervicitis 02/28/2014   Treated.   . Depression    age 57yo, resolved  . Eczema   . GC (gonococcus) 02/28/2014   Sexual contact.  Negative culture, per patient.  Treated for contact.   . Gonorrhea   . Insomnia   . Routine gynecological examination    Dr. Jean Rosenthal  . Wears glasses     Past Surgical History:  Procedure Laterality Date  . DILATION AND EVACUATION N/A 06/29/2014   Procedure: DILATATION AND EVACUATION;  Surgeon: Brock Bad, MD;  Location: [facility];  Service: Gynecology;  Laterality: N/A;  . DILATION AND EVACUATION N/A 08/16/2014   Procedure: DILATATION AND EVACUATION;  Surgeon: Maxie Better, MD;  Location: [facility];  Service: Gynecology;  Laterality:  N/A;  . OPERATIVE ULTRASOUND N/A 08/16/2014   Procedure: OPERATIVE ULTRASOUND;  Surgeon: Maxie Better, MD;  Location: [facility];  Service: Gynecology;  Laterality: N/A;  . WISDOM TOOTH EXTRACTION      Family History  Problem Relation Age of Onset  . Diabetes Mother   . Diabetes Maternal Grandmother   . Diabetes Paternal Grandmother     Social History   Tobacco Use  . Smoking status: Current Every Day Smoker    Types: Cigars    Last attempt to quit: 02/24/2015    Years since quitting: 5.3  . Smokeless tobacco: Never Used  Substance Use Topics  . Alcohol use: No    Alcohol/week: 0.0 standard drinks    Comment: social  . Drug use: Yes    Frequency: 7.0 times per week    Types: Marijuana    Comment: last used yesterday    Allergies:  Allergies  Allergen Reactions  . Nickel Rash    Medications Prior to Admission  Medication Sig Dispense Refill Last Dose  . ALPRAZolam (XANAX) 0.25 MG tablet Take 1 tablet (0.25 mg total) by mouth 2 (two) times daily as needed for anxiety. 20 tablet 0   . escitalopram (LEXAPRO) 10 MG tablet Take 1  tablet (10 mg total) by mouth daily. 30 tablet 1   . metroNIDAZOLE (FLAGYL) 500 MG tablet 4 tablets once, don't consume alcohol while taking the medication (Patient not taking: Reported on 02/24/2020) 4 tablet 0   . triamcinolone cream (KENALOG) 0.1 % Apply 1 application topically 2 (two) times daily. 30 g 0     Review of Systems  Constitutional: Positive for chills. Negative for fever.  Respiratory: Negative.   Cardiovascular: Negative.   Gastrointestinal: Positive for diarrhea, nausea and vomiting. Negative for abdominal pain and constipation.  Genitourinary: Negative.   Musculoskeletal: Negative.   Neurological: Negative.   Psychiatric/Behavioral: Negative.    Physical Exam   Blood pressure (!) 100/58, pulse 63, temperature 98 F (36.7 C), temperature source Oral, resp. rate 16, weight 60 kg, last menstrual period 05/14/2020, SpO2 100  %.  Physical Exam Vitals and nursing note reviewed.  HENT:     Head: Normocephalic.  Cardiovascular:     Rate and Rhythm: Normal rate and regular rhythm.  Pulmonary:     Effort: Pulmonary effort is normal. No respiratory distress.     Breath sounds: Normal breath sounds. No wheezing.  Abdominal:     General: There is no distension.     Palpations: Abdomen is soft. There is no mass.     Tenderness: There is no abdominal tenderness. There is no guarding.  Musculoskeletal:     Right lower leg: No edema.     Left lower leg: No edema.  Skin:    General: Skin is warm and dry.  Neurological:     Mental Status: She is alert and oriented to person, place, and time.  Psychiatric:        Mood and Affect: Mood normal.        Thought Content: Thought content normal.     MAU Course  Procedures  MDM Orders Placed This Encounter  Procedures  . SARS Coronavirus 2 by RT PCR (hospital order, performed in [facility] hospital lab) Nasopharyngeal Nasopharyngeal Swab  . US OB LESS THAN 14 WEEKS WITH OB TRANSVAGINAL  . Urinalysis, Routine w reflex microscopic  . CBC  . Comprehensive metabolic panel  . hCG, quantitative, pregnancy  . Airborne and Contact precautions  . Pregnancy, urine POC  . ABO/Rh  . Insert peripheral IV   Meds ordered this encounter  Medications  . lactated ringers bolus 1,000 mL  . promethazine (PHENERGAN) injection 12.5 mg   Results for orders placed or performed during the hospital encounter of 06/30/20 (from the past 24 hour(s))  Pregnancy, urine POC     Status: Abnormal   Collection Time: 06/30/20 10:29 PM  Result Value Ref Range   Preg Test, Ur POSITIVE (A) NEGATIVE  Urinalysis, Routine w reflex microscopic Nasopharyngeal Swab     Status: Abnormal   Collection Time: 06/30/20 10:52 PM  Result Value Ref Range   Color, Urine YELLOW YELLOW   APPearance CLEAR CLEAR   Specific Gravity, Urine 1.015 1.005 - 1.030   pH 7.0 5.0 - 8.0   Glucose, UA NEGATIVE  NEGATIVE mg/dL   Hgb urine dipstick NEGATIVE NEGATIVE   Bilirubin Urine NEGATIVE NEGATIVE   Ketones, ur 20 (A) NEGATIVE mg/dL   Protein, ur NEGATIVE NEGATIVE mg/dL   Nitrite NEGATIVE NEGATIVE   Leukocytes,Ua NEGATIVE NEGATIVE  SARS Coronavirus 2 by RT PCR (hospital order, performed in [facility] hospital lab) Nasopharyngeal Nasopharyngeal Swab     Status: None   Collection Time: 07/01/20 12:08 AM   Specimen: Nasopharyngeal  Swab  Result Value Ref Range   SARS Coronavirus 2 NEGATIVE NEGATIVE  CBC     Status: None   Collection Time: 07/01/20 12:15 AM  Result Value Ref Range   WBC 6.3 4.0 - 10.5 K/uL   RBC 4.22 3.87 - 5.11 MIL/uL   Hemoglobin 13.0 12.0 - 15.0 g/dL   HCT 52.8 41.3 - 24.4 %   MCV 88.6 80.0 - 100.0 fL   MCH 30.8 26.0 - 34.0 pg   MCHC 34.8 30.0 - 36.0 g/dL   RDW 01.0 27.2 - 53.6 %   Platelets 202 150 - 400 K/uL   nRBC 0.0 0.0 - 0.2 %  ABO/Rh     Status: None   Collection Time: 07/01/20 12:15 AM  Result Value Ref Range   ABO/RH(D) O POS    No rh immune globuloin      NOT A RH IMMUNE GLOBULIN CANDIDATE, PT RH POSITIVE Performed at [facility], [address] [place], [place] [address]   Comprehensive metabolic panel     Status: Abnormal   Collection Time: 07/01/20 12:15 AM  Result Value Ref Range   Sodium 135 135 - 145 mmol/L   Potassium 3.6 3.5 - 5.1 mmol/L   Chloride 102 98 - 111 mmol/L   CO2 23 22 - 32 mmol/L   Glucose, Bld 85 70 - 99 mg/dL   BUN 6 6 - 20 mg/dL   Creatinine, Ser 4.74 0.44 - 1.00 mg/dL   Calcium 9.2 8.9 - 25.9 mg/dL   Total Protein 6.7 6.5 - 8.1 g/dL   Albumin 3.9 3.5 - 5.0 g/dL   AST 18 15 - 41 U/L   ALT 17 0 - 44 U/L   Alkaline Phosphatase 35 (L) 38 - 126 U/L   Total Bilirubin 1.6 (H) 0.3 - 1.2 mg/dL   GFR, Estimated >56 >38 mL/min   Anion gap 10 5 - 15  hCG, quantitative, pregnancy     Status: Abnormal   Collection Time: 07/01/20 12:15 AM  Result Value Ref Range   hCG, Beta Chain, Quant, S 70,261 (H) <5 mIU/mL    UA  reviewed and noted 20 ketones  Educated and discussed with patient mild dehydration and option of oral hydration with medication for nausea or IV hydration and medication. Patient prefers IV medication and hydration.   MAU treatments including IV LR bolus and phenergan IV. Pregnancy of unknown location workup included with labs and [place].   Reassessment of patient after medication and labs. Patient reports that phenergan is only making her sleepy but has resolved nausea - reports that she does not like how phenergan makes her feel. Educated and discussed other medication for nausea and vomiting. Rx for zofran sent to pharmacy of choice.   Reviewed [place] with patient  US OB Comp Less 14 Wks  Result Date: 07/01/2020 CLINICAL DATA:  Pregnancy of unknown location EXAM: OBSTETRIC <14 WK ULTRASOUND TECHNIQUE: Transabdominal ultrasound was performed for evaluation of the gestation as well as the maternal uterus and adnexal regions. COMPARISON:  None. FINDINGS: Intrauterine gestational sac: Single Yolk sac:  Visualized. Embryo:  Visualized. Cardiac Activity: Visualized. Heart Rate: 124 bpm MSD:    mm    w     d CRL:   7.1 mm   6 w 4 d                  [place] EDC: 02/20/2021 Subchorionic hemorrhage:  None visualized. Maternal uterus/adnexae: No adnexal mass. Small amount of  free fluid in the pelvis. IMPRESSION: Six week 4 day intrauterine pregnancy. Fetal heart rate 124 beats per minute. No acute maternal findings. Electronically Signed   By: Charlett Nose M.D.   On: 07/01/2020 01:15   Normal IUP seen on ultrasound with FHR of 124. Covid negative and FLU pending at this time.   Discussed reasons to return to MAU. Return to MAU as needed. Pt stable at time of discharge.   Assessment and Plan   1. Normal IUP (intrauterine pregnancy) on prenatal ultrasound, first trimester   2. Pregnancy of unknown anatomic location   3. Nausea and vomiting during pregnancy   4. Chills without fever   5. Diarrhea during pregnancy    6. [redacted] weeks gestation of pregnancy    Discharge home Follow up as scheduled in the office for prenatal care Return to MAU as needed for reasons discussed and/or emergencies  Rx for zofran     Allergies as of 07/01/2020      Reactions   Nickel Rash      Medication List    STOP taking these medications   metroNIDAZOLE 500 MG tablet Commonly known as: FLAGYL     TAKE these medications   ALPRAZolam 0.25 MG tablet Commonly known as: XANAX Take 1 tablet (0.25 mg total) by mouth 2 (two) times daily as needed for anxiety.   escitalopram 10 MG tablet Commonly known as: Lexapro Take 1 tablet (10 mg total) by mouth daily.   ondansetron 4 MG disintegrating tablet Commonly known as: Zofran ODT Take 1 tablet (4 mg total) by mouth every 8 (eight) hours as needed for nausea or vomiting.   triamcinolone 0.1 % Commonly known as: KENALOG Apply 1 application topically 2 (two) times daily.       Sharyon Cable CNM 07/01/2020, 3:58 AM

## 2020-07-10 LAB — OB RESULTS CONSOLE RUBELLA ANTIBODY, IGM: Rubella: IMMUNE

## 2020-07-10 LAB — OB RESULTS CONSOLE HEPATITIS B SURFACE ANTIGEN: Hepatitis B Surface Ag: NEGATIVE

## 2020-07-10 LAB — OB RESULTS CONSOLE GC/CHLAMYDIA
Chlamydia: NEGATIVE
Gonorrhea: NEGATIVE

## 2020-07-10 LAB — OB RESULTS CONSOLE RPR: RPR: NONREACTIVE

## 2020-07-10 LAB — OB RESULTS CONSOLE HIV ANTIBODY (ROUTINE TESTING): HIV: NONREACTIVE

## 2020-07-26 ENCOUNTER — Encounter: Payer: Self-pay | Admitting: Obstetrics & Gynecology

## 2020-10-08 ENCOUNTER — Other Ambulatory Visit: Payer: Self-pay | Admitting: Obstetrics and Gynecology

## 2020-10-08 DIAGNOSIS — Z363 Encounter for antenatal screening for malformations: Secondary | ICD-10-CM

## 2020-10-08 DIAGNOSIS — Z3A20 20 weeks gestation of pregnancy: Secondary | ICD-10-CM

## 2020-10-09 ENCOUNTER — Ambulatory Visit: Payer: BC Managed Care – PPO | Attending: Obstetrics and Gynecology

## 2020-10-09 ENCOUNTER — Other Ambulatory Visit: Payer: Self-pay | Admitting: Obstetrics and Gynecology

## 2020-10-09 ENCOUNTER — Other Ambulatory Visit: Payer: Self-pay

## 2020-10-09 DIAGNOSIS — O321XX0 Maternal care for breech presentation, not applicable or unspecified: Secondary | ICD-10-CM

## 2020-10-09 DIAGNOSIS — O358XX0 Maternal care for other (suspected) fetal abnormality and damage, not applicable or unspecified: Secondary | ICD-10-CM | POA: Diagnosis not present

## 2020-10-09 DIAGNOSIS — O99342 Other mental disorders complicating pregnancy, second trimester: Secondary | ICD-10-CM

## 2020-10-09 DIAGNOSIS — F32A Depression, unspecified: Secondary | ICD-10-CM | POA: Diagnosis not present

## 2020-10-09 DIAGNOSIS — F419 Anxiety disorder, unspecified: Secondary | ICD-10-CM

## 2020-10-09 DIAGNOSIS — Z363 Encounter for antenatal screening for malformations: Secondary | ICD-10-CM

## 2020-10-09 DIAGNOSIS — A549 Gonococcal infection, unspecified: Secondary | ICD-10-CM

## 2020-10-09 DIAGNOSIS — Z3A20 20 weeks gestation of pregnancy: Secondary | ICD-10-CM | POA: Insufficient documentation

## 2020-10-09 DIAGNOSIS — A562 Chlamydial infection of genitourinary tract, unspecified: Secondary | ICD-10-CM

## 2020-10-09 DIAGNOSIS — O98312 Other infections with a predominantly sexual mode of transmission complicating pregnancy, second trimester: Secondary | ICD-10-CM

## 2020-11-30 ENCOUNTER — Encounter (HOSPITAL_COMMUNITY): Payer: Self-pay | Admitting: Obstetrics and Gynecology

## 2020-11-30 ENCOUNTER — Inpatient Hospital Stay (HOSPITAL_COMMUNITY)
Admission: AD | Admit: 2020-11-30 | Discharge: 2020-11-30 | Disposition: A | Discharge status: Home / Self Care | Payer: BC Managed Care – PPO | Attending: Obstetrics and Gynecology | Admitting: Obstetrics and Gynecology

## 2020-11-30 ENCOUNTER — Other Ambulatory Visit: Payer: Self-pay

## 2020-11-30 DIAGNOSIS — Z3689 Encounter for other specified antenatal screening: Secondary | ICD-10-CM

## 2020-11-30 DIAGNOSIS — Z87891 Personal history of nicotine dependence: Secondary | ICD-10-CM | POA: Insufficient documentation

## 2020-11-30 DIAGNOSIS — O98813 Other maternal infectious and parasitic diseases complicating pregnancy, third trimester: Secondary | ICD-10-CM | POA: Diagnosis not present

## 2020-11-30 DIAGNOSIS — Z79899 Other long term (current) drug therapy: Secondary | ICD-10-CM | POA: Diagnosis not present

## 2020-11-30 DIAGNOSIS — Z3A28 28 weeks gestation of pregnancy: Secondary | ICD-10-CM | POA: Insufficient documentation

## 2020-11-30 DIAGNOSIS — B3731 Acute candidiasis of vulva and vagina: Secondary | ICD-10-CM

## 2020-11-30 DIAGNOSIS — B373 Candidiasis of vulva and vagina: Secondary | ICD-10-CM

## 2020-11-30 LAB — WET PREP, GENITAL
Clue Cells Wet Prep HPF POC: NONE SEEN
Sperm: NONE SEEN
Trich, Wet Prep: NONE SEEN

## 2020-11-30 MED ORDER — TERCONAZOLE 0.4 % VA CREA: 1.0000 | TOPICAL_CREAM | Freq: Every day | VAGINAL | 0 refills | Status: DC

## 2020-11-30 NOTE — MAU Provider Note (Signed)
History     CSN: [id number]  Arrival date and time: 11/30/20 1312   Event Date/Time   First Provider Initiated Contact with Patient 11/30/20 1442      Chief Complaint  Patient presents with   Vaginal Discharge   30 y.o. [id number] @28 .4 wks presenting with vaginal discharge. States she saw a mucus vaginal discharge yesterday which looked liked nasal mucus. Today the discharge was tan colored. Denies itching or malodor. Denies STD exposure. No recent IC. Denies VB or LOF. Denies ctx but reports the baby has been "balling up" since yesterday.    OB History     Gravida  4   Para  1   Term  1   Preterm  0   AB  2   Living  1      SAB  2   IAB  0   Ectopic  0   Multiple  0   Live Births  1           Past Medical History:  Diagnosis Date   Anxiety    Chlamydia contact, treated 02-2014   Chlamydial cervicitis 02/28/2014   Treated.    Depression    age 37yo, resolved   Eczema    GC (gonococcus) 02/28/2014   Sexual contact.  Negative culture, per patient.  Treated for contact.    Gonorrhea    Insomnia    Routine gynecological examination    Dr. 04/30/2014   Wears glasses     Past Surgical History:  Procedure Laterality Date   DILATION AND EVACUATION N/A 06/29/2014   Procedure: DILATATION AND EVACUATION;  Surgeon: 08/28/2014, MD;  Location: [facility];  Service: Gynecology;  Laterality: N/A;   DILATION AND EVACUATION N/A 08/16/2014   Procedure: DILATATION AND EVACUATION;  Surgeon: 08/18/2014, MD;  Location: [facility];  Service: Gynecology;  Laterality: N/A;   OPERATIVE ULTRASOUND N/A 08/16/2014   Procedure: OPERATIVE ULTRASOUND;  Surgeon: 08/18/2014, MD;  Location: [facility];  Service: Gynecology;  Laterality: N/A;   WISDOM TOOTH EXTRACTION      Family History  Problem Relation Age of Onset   Diabetes Mother    Diabetes Maternal Grandmother    Diabetes Paternal Grandmother     Social History   Tobacco Use   Smoking status: Former    Pack  years: 0.00    Types: Cigars    Quit date: 02/24/2015    Years since quitting: 5.7   Smokeless tobacco: Never  Vaping Use   Vaping Use: Never used  Substance Use Topics   Alcohol use: Not Currently    Comment: social   Drug use: Not Currently    Frequency: 7.0 times per week    Types: Marijuana    Comment: last used yesterday    Allergies:  Allergies  Allergen Reactions   Nickel Rash    No medications prior to admission.    Review of Systems  Gastrointestinal:  Negative for abdominal pain.  Genitourinary:  Positive for vaginal discharge. Negative for vaginal bleeding.  Physical Exam   Blood pressure 108/67, pulse 82, temperature 97.9 F (36.6 C), temperature source Oral, resp. rate 19, height 5' (1.524 m), weight 70.5 kg, last menstrual period 05/14/2020, SpO2 100 %.  Physical Exam Vitals and nursing note reviewed. Exam conducted with a chaperone present.  Constitutional:      General: She is not in acute distress.    Appearance: Normal appearance.  HENT:     Head: Normocephalic and  atraumatic.  Cardiovascular:     Rate and Rhythm: Normal rate.  Pulmonary:     Effort: Pulmonary effort is normal. No respiratory distress.  Abdominal:     Palpations: Abdomen is soft.     Tenderness: There is no abdominal tenderness.  Genitourinary:    Comments: External: no lesions or erythema Vagina: rugated, pink, moist, scant thin white discharge Cervix closed/thick   Musculoskeletal:        General: Normal range of motion.     Cervical back: Normal range of motion.  Skin:    General: Skin is warm and dry.  Neurological:     General: No focal deficit present.     Mental Status: She is alert and oriented to person, place, and time.  Psychiatric:        Mood and Affect: Mood normal.        Behavior: Behavior normal.  EFM: 135 bpm, mod variability, + accels, no decels Toco: none  Results for orders placed or performed during the hospital encounter of 11/30/20 (from the  past 24 hour(s))  Wet prep, genital     Status: Abnormal   Collection Time: 11/30/20  3:00 PM   Specimen: PATH Cytology Cervicovaginal Ancillary Only  Result Value Ref Range   Yeast Wet Prep HPF POC PRESENT (A) NONE SEEN   Trich, Wet Prep NONE SEEN NONE SEEN   Clue Cells Wet Prep HPF POC NONE SEEN NONE SEEN   WBC, Wet Prep HPF POC MANY (A) NONE SEEN   Sperm NONE SEEN    MAU Course  Procedures  MDM Getting care at [facility], no prenatal record on file. No signs of PTL. Will treat yeast. GC pending. Stable for discharge home.   Assessment and Plan   1. [redacted] weeks gestation of pregnancy   2. NST (non-stress test) reactive   3. Vaginal yeast infection    Discharge home Follow up at [facility] as scheduled Rx Terazol  Allergies as of 11/30/2020       Reactions   Nickel Rash        Medication List     STOP taking these medications    ALPRAZolam 0.25 MG tablet Commonly known as: XANAX   escitalopram 10 MG tablet Commonly known as: Lexapro   ondansetron 4 MG disintegrating tablet Commonly known as: Zofran ODT   triamcinolone cream 0.1 % Commonly known as: KENALOG       TAKE these medications    terconazole 0.4 % vaginal cream Commonly known as: Terazol 7 Place 1 applicator vaginally at bedtime.       Donette Larry, CNM 11/30/2020, 5:09 PM

## 2020-11-30 NOTE — MAU Note (Signed)
Christine Ross is a 30 y.o. at [redacted]w[redacted]d here in MAU reporting: yesterday noticed a mucus discharge, states it looked like snot. Today saw some tan discharge. No bleeding or LOF. No pain. +FM  Onset of complaint: yesterday  Pain score: 0/10  Vitals:   11/30/20 1328  BP: 112/65  Pulse: 91  Resp: 16  Temp: 98.5 F (36.9 C)  SpO2: 100%     FHT:142  Lab orders placed from triage: none

## 2020-12-11 LAB — OB RESULTS CONSOLE RPR: RPR: NONREACTIVE

## 2021-01-24 LAB — OB RESULTS CONSOLE GBS: GBS: NEGATIVE

## 2021-01-29 ENCOUNTER — Encounter (HOSPITAL_COMMUNITY): Payer: Self-pay | Admitting: Obstetrics and Gynecology

## 2021-01-29 ENCOUNTER — Inpatient Hospital Stay (HOSPITAL_COMMUNITY)
Admission: AD | Admit: 2021-01-29 | Discharge: 2021-01-30 | Disposition: A | Discharge status: Home / Self Care | Payer: BC Managed Care – PPO | Attending: Obstetrics and Gynecology | Admitting: Obstetrics and Gynecology

## 2021-01-29 ENCOUNTER — Other Ambulatory Visit: Payer: Self-pay

## 2021-01-29 DIAGNOSIS — O471 False labor at or after 37 completed weeks of gestation: Secondary | ICD-10-CM | POA: Insufficient documentation

## 2021-01-29 DIAGNOSIS — O479 False labor, unspecified: Secondary | ICD-10-CM

## 2021-01-29 DIAGNOSIS — M5431 Sciatica, right side: Secondary | ICD-10-CM | POA: Diagnosis not present

## 2021-01-29 DIAGNOSIS — M79605 Pain in left leg: Secondary | ICD-10-CM | POA: Insufficient documentation

## 2021-01-29 DIAGNOSIS — O26899 Other specified pregnancy related conditions, unspecified trimester: Secondary | ICD-10-CM

## 2021-01-29 DIAGNOSIS — O99891 Other specified diseases and conditions complicating pregnancy: Secondary | ICD-10-CM | POA: Diagnosis not present

## 2021-01-29 DIAGNOSIS — O99353 Diseases of the nervous system complicating pregnancy, third trimester: Secondary | ICD-10-CM | POA: Insufficient documentation

## 2021-01-29 DIAGNOSIS — Z87891 Personal history of nicotine dependence: Secondary | ICD-10-CM | POA: Insufficient documentation

## 2021-01-29 DIAGNOSIS — G5601 Carpal tunnel syndrome, right upper limb: Secondary | ICD-10-CM

## 2021-01-29 DIAGNOSIS — G5600 Carpal tunnel syndrome, unspecified upper limb: Secondary | ICD-10-CM

## 2021-01-29 DIAGNOSIS — M5432 Sciatica, left side: Secondary | ICD-10-CM | POA: Diagnosis not present

## 2021-01-29 DIAGNOSIS — O26893 Other specified pregnancy related conditions, third trimester: Secondary | ICD-10-CM | POA: Insufficient documentation

## 2021-01-29 DIAGNOSIS — Z3A37 37 weeks gestation of pregnancy: Secondary | ICD-10-CM

## 2021-01-29 DIAGNOSIS — M79604 Pain in right leg: Secondary | ICD-10-CM | POA: Insufficient documentation

## 2021-01-29 NOTE — MAU Provider Note (Signed)
Chief Complaint:  Contractions   Event Date/Time   First Provider Initiated Contact with Patient 01/29/21 2358     HPI: Christine Ross is a 30 y.o. G4P1021 at 77w1dwho presents to maternity admissions reporting mild contractions and sharp pains in buttocks radiating down the back of both legs, but moreso on right.  .Also c/o pain in right fingers c/w carpal tunnel syndrome   She reports good fetal movement, denies LOF, vaginal bleeding, vaginal itching/burning, urinary symptoms, h/a, dizziness, n/v, diarrhea, constipation or fever/chills.    Back Pain This is a recurrent problem. The current episode started 1 to 4 weeks ago. The problem is unchanged. The pain is present in the sacro-iliac. The quality of the pain is described as shooting. The symptoms are aggravated by position, lying down and standing. Stiffness is present All day. Pertinent negatives include no abdominal pain, dysuria, fever, headaches, numbness, paresis, paresthesias, tingling or weakness. She has tried nothing for the symptoms.   RN Note: Patient reports pressure in her lower mid abd. Patient also reports having pain in her back that radiates down her legs. She describes pain as cramping sensation. She is rating her pain at a 3/10.   Past Medical History: Past Medical History:  Diagnosis Date   Anxiety    Chlamydia contact, treated 02-2014   Chlamydial cervicitis 02/28/2014   Treated.    Depression    age 32yo, resolved   Eczema    GC (gonococcus) 02/28/2014   Sexual contact.  Negative culture, per patient.  Treated for contact.    Gonorrhea    Insomnia    Routine gynecological examination    Dr. Jean Rosenthal   Wears glasses     Past obstetric history: OB History  Gravida Para Term Preterm AB Living  4 1 1  0 2 1  SAB IAB Ectopic Multiple Live Births  2 0 0 0 1    # Outcome Date GA Lbr Len/2nd Weight Sex Delivery Anes PTL Lv  4 Current           3 Term 11/25/15 [redacted]w[redacted]d 20:26 / 01:31 3030 g M Vag-Spont EPI  LIV   2 SAB 06/29/14          1 SAB             Past Surgical History: Past Surgical History:  Procedure Laterality Date   DILATION AND EVACUATION N/A 06/29/2014   Procedure: DILATATION AND EVACUATION;  Surgeon: 08/28/2014, MD;  Location: [facility];  Service: Gynecology;  Laterality: N/A;   DILATION AND EVACUATION N/A 08/16/2014   Procedure: DILATATION AND EVACUATION;  Surgeon: 08/18/2014, MD;  Location: [facility];  Service: Gynecology;  Laterality: N/A;   OPERATIVE ULTRASOUND N/A 08/16/2014   Procedure: OPERATIVE ULTRASOUND;  Surgeon: 08/18/2014, MD;  Location: [facility];  Service: Gynecology;  Laterality: N/A;   WISDOM TOOTH EXTRACTION      Family History: Family History  Problem Relation Age of Onset   Diabetes Mother    Diabetes Maternal Grandmother    Diabetes Paternal Grandmother     Social History: Social History   Tobacco Use   Smoking status: Former    Types: Cigars    Quit date: 02/24/2015    Years since quitting: 5.9   Smokeless tobacco: Never  Vaping Use   Vaping Use: Never used  Substance Use Topics   Alcohol use: Not Currently    Comment: social   Drug use: Not Currently    Frequency: 7.0 times per week  Types: Marijuana    Comment: last used yesterday    Allergies:  Allergies  Allergen Reactions   Nickel Rash    Meds:  Medications Prior to Admission  Medication Sig Dispense Refill Last Dose   terconazole (TERAZOL 7) 0.4 % vaginal cream Place 1 applicator vaginally at bedtime. 45 g 0     I have reviewed patient's Past Medical Hx, Surgical Hx, Family Hx, Social Hx, medications and allergies.   ROS:  Review of Systems  Constitutional:  Negative for fever.  Gastrointestinal:  Negative for abdominal pain.  Genitourinary:  Negative for dysuria.  Musculoskeletal:  Positive for back pain.  Neurological:  Negative for tingling, weakness, numbness, headaches and paresthesias.  Other systems negative  Physical Exam  Patient Vitals for the  past 24 hrs:  BP Temp Temp src Pulse Resp Height Weight  01/29/21 2239 118/68 98.1 F (36.7 C) Oral -- 18 5' (1.524 m) 73 kg  01/29/21 2216 113/71 98.7 F (37.1 C) -- 93 17 5' (1.524 m) 73 kg   Constitutional: Well-developed, well-nourished female in no acute distress.  Cardiovascular: normal rate and rhythm Respiratory: normal effort, clear to auscultation bilaterally GI: Abd soft, non-tender, gravid appropriate for gestational age.   No rebound or guarding. MS: Extremities nontender, no edema, normal ROM  pain pattern c/w sciatica.  Good strength in both hands.  Pain pattern c/w carpal tunnel syndrome. Neurologic: Alert and oriented x 4.  GU: Neg CVAT.  PELVIC EXAM: Dilation: 1 Effacement (%): 60 Station: -3 Presentation: Vertex Exam by:: Chriss Driver, RN  FHT:  Baseline 130 , moderate variability, accelerations present, no decelerations Contractions:  Irregular     Labs: No results found for this or any previous visit (from the past 24 hour(s)). --/--/O POS (02/06 0015)  Imaging:  No results found.  MAU Course/MDM: NST reviewed, reassuring.  Treatments in MAU included EFM  Discussed sciatica and carpal tunnel   Recommend stretching and maybe PT for former and wrist splint for carpal tunnel .    Assessment: Single IUP at [redacted]w[redacted]d Irregular contractions Sciatica Right carpal tunnel syndrome  Plan: Discharge home Labor precautions and fetal kick counts Have office refer for PT Wrist splint Follow up in Office for prenatal visits  Encouraged to return if she develops worsening of symptoms, increase in pain, fever, or other concerning symptoms.  Pt stable at time of discharge.  Wynelle Bourgeois CNM, MSN Certified Nurse-Midwife 01/29/2021 11:58 PM

## 2021-01-29 NOTE — MAU Note (Signed)
Ctxs since 2008. Denies LOF or VB. Cervix closed last Thurs and cervix thin. Good FM today but alittle stiller this past hour

## 2021-01-30 DIAGNOSIS — O26893 Other specified pregnancy related conditions, third trimester: Secondary | ICD-10-CM | POA: Diagnosis not present

## 2021-01-30 DIAGNOSIS — Z3A37 37 weeks gestation of pregnancy: Secondary | ICD-10-CM | POA: Diagnosis not present

## 2021-01-30 DIAGNOSIS — O99353 Diseases of the nervous system complicating pregnancy, third trimester: Secondary | ICD-10-CM | POA: Diagnosis not present

## 2021-01-30 DIAGNOSIS — G5601 Carpal tunnel syndrome, right upper limb: Secondary | ICD-10-CM | POA: Diagnosis not present

## 2021-01-30 DIAGNOSIS — M79605 Pain in left leg: Secondary | ICD-10-CM | POA: Diagnosis not present

## 2021-01-30 DIAGNOSIS — Z87891 Personal history of nicotine dependence: Secondary | ICD-10-CM | POA: Diagnosis not present

## 2021-01-30 DIAGNOSIS — M79604 Pain in right leg: Secondary | ICD-10-CM | POA: Diagnosis present

## 2021-01-30 DIAGNOSIS — O471 False labor at or after 37 completed weeks of gestation: Secondary | ICD-10-CM | POA: Diagnosis not present

## 2021-01-30 NOTE — MAU Note (Signed)
Patient reports pressure in her lower mid abd. Patient also reports having pain in her back that radiates down her legs. She describes pain as cramping sensation. She is rating her pain at a 3/10.   FHR is a cat 1 tracing. UC Q3-5 min. Palpating mild, with +FM.   SVE 1/60/-3 provider notified of patients arrival.

## 2021-02-04 ENCOUNTER — Encounter (HOSPITAL_COMMUNITY): Payer: Self-pay | Admitting: Obstetrics and Gynecology

## 2021-02-04 ENCOUNTER — Inpatient Hospital Stay (HOSPITAL_COMMUNITY)
Admission: AD | Admit: 2021-02-04 | Discharge: 2021-02-04 | Disposition: A | Discharge status: Home / Self Care | Payer: BC Managed Care – PPO | Attending: Obstetrics and Gynecology | Admitting: Obstetrics and Gynecology

## 2021-02-04 ENCOUNTER — Other Ambulatory Visit: Payer: Self-pay

## 2021-02-04 DIAGNOSIS — Z3689 Encounter for other specified antenatal screening: Secondary | ICD-10-CM | POA: Diagnosis not present

## 2021-02-04 DIAGNOSIS — O471 False labor at or after 37 completed weeks of gestation: Secondary | ICD-10-CM | POA: Diagnosis not present

## 2021-02-04 DIAGNOSIS — Z3A38 38 weeks gestation of pregnancy: Secondary | ICD-10-CM

## 2021-02-04 DIAGNOSIS — O479 False labor, unspecified: Secondary | ICD-10-CM

## 2021-02-04 NOTE — MAU Provider Note (Signed)
Event Date/Time   First Provider Initiated Contact with Patient 02/04/21 1228       S: [name] is a 30 y.o. [phone] at [redacted]w[redacted]d  who presents to MAU today complaining of contractions since last night. She denies vaginal bleeding. She denies LOF. She reports decreased fetal movement since last night.    O: BP 123/81   Pulse 81   Temp 97.9 F (36.6 C) (Oral)   Resp 17   Ht 5' (1.524 m)   Wt 73.3 kg   LMP 05/14/2020   SpO2 100%   BMI 31.54 kg/m  GENERAL: Well-developed, well-nourished female in no acute distress.  HEAD: Normocephalic, atraumatic.  CHEST: Normal effort of breathing, regular heart rate ABDOMEN: Soft, nontender, gravid  Cervical exam:  Dilation: 3 Effacement (%): 50 Cervical Position: Middle Station: -2 Presentation: Vertex Exam by:: TLYTLE RN  Fetal Monitoring: Baseline: 125 Variability: mod Accelerations: + Decelerations: no Contractions: irregular, UI   MDM: NST reactive, pt feeling good FM since EFM applied and marked on tracing >10 times. Cervix rechecked, no signs of labor. Stable for discharge home.   A: SIUP at [redacted]w[redacted]d  False labor Reactive NST  P: Discharge home Follow up at [facility] tomorrow Labor precautions  Allergies as of 02/04/2021       Reactions   Nickel Rash        Medication List     STOP taking these medications    terconazole 0.4 % vaginal cream Commonly known as: Terazol 7        Donette Larry, [place] 02/04/2021 1:51 PM

## 2021-02-04 NOTE — MAU Note (Signed)
Pt reports contractions that began last night. She can not tell how far apart, but describes as baby "balling up." She also reports decreased fetal movement and pelvic pressure when she walks. Was 2  cm on Thursday.

## 2021-02-04 NOTE — MAU Note (Signed)
I have communicated with Christine Ross and reviewed vital signs:  Vitals:   02/04/21 1349 02/04/21 1405  BP: 123/81 115/77  Pulse: 81 81  Resp:  16  Temp:    SpO2:  100%    Vaginal exam:  Dilation: 3 Effacement (%): 50 Cervical Position: Middle Station: -2 Presentation: Vertex Exam by:: TLYTLE RN,   Also reviewed contraction pattern and that non-stress test is reactive.  It has been documented that patient is contracting every irregularly  with no cervical change over 1.5 hours not indicating active labor.  Patient denies any other complaints.  Based on this report provider has given order for discharge.  A discharge order and diagnosis entered by a provider.   Labor discharge instructions reviewed with patient.

## 2021-02-07 ENCOUNTER — Encounter: Payer: Self-pay | Admitting: Internal Medicine

## 2021-02-18 ENCOUNTER — Encounter (HOSPITAL_COMMUNITY): Payer: Self-pay | Admitting: Obstetrics & Gynecology

## 2021-02-18 ENCOUNTER — Inpatient Hospital Stay (HOSPITAL_COMMUNITY): Payer: BC Managed Care – PPO | Admitting: Anesthesiology

## 2021-02-18 ENCOUNTER — Inpatient Hospital Stay (HOSPITAL_COMMUNITY)
Admission: AD | Admit: 2021-02-18 | Discharge: 2021-02-20 | DRG: 807 | Disposition: A | Discharge status: Home / Self Care | Payer: BC Managed Care – PPO | Attending: Obstetrics & Gynecology | Admitting: Obstetrics & Gynecology

## 2021-02-18 ENCOUNTER — Other Ambulatory Visit: Payer: Self-pay

## 2021-02-18 DIAGNOSIS — Z20822 Contact with and (suspected) exposure to covid-19: Secondary | ICD-10-CM | POA: Diagnosis present

## 2021-02-18 DIAGNOSIS — O4202 Full-term premature rupture of membranes, onset of labor within 24 hours of rupture: Secondary | ICD-10-CM | POA: Diagnosis not present

## 2021-02-18 DIAGNOSIS — Z87891 Personal history of nicotine dependence: Secondary | ICD-10-CM | POA: Diagnosis not present

## 2021-02-18 DIAGNOSIS — O99214 Obesity complicating childbirth: Secondary | ICD-10-CM | POA: Diagnosis present

## 2021-02-18 DIAGNOSIS — O26893 Other specified pregnancy related conditions, third trimester: Secondary | ICD-10-CM | POA: Diagnosis present

## 2021-02-18 DIAGNOSIS — Z3A39 39 weeks gestation of pregnancy: Secondary | ICD-10-CM

## 2021-02-18 DIAGNOSIS — R030 Elevated blood-pressure reading, without diagnosis of hypertension: Secondary | ICD-10-CM | POA: Diagnosis present

## 2021-02-18 DIAGNOSIS — Z23 Encounter for immunization: Secondary | ICD-10-CM | POA: Diagnosis not present

## 2021-02-18 LAB — POCT FERN TEST: POCT Fern Test: NEGATIVE

## 2021-02-18 LAB — CBC
HCT: 32.2 % — ABNORMAL LOW (ref 36.0–46.0)
Hemoglobin: 10.8 g/dL — ABNORMAL LOW (ref 12.0–15.0)
MCH: 28.6 pg (ref 26.0–34.0)
MCHC: 33.5 g/dL (ref 30.0–36.0)
MCV: 85.4 fL (ref 80.0–100.0)
Platelets: 208 10*3/uL (ref 150–400)
RBC: 3.77 MIL/uL — ABNORMAL LOW (ref 3.87–5.11)
RDW: 13.6 % (ref 11.5–15.5)
WBC: 5.6 10*3/uL (ref 4.0–10.5)
nRBC: 0 % (ref 0.0–0.2)

## 2021-02-18 LAB — COMPREHENSIVE METABOLIC PANEL
ALT: 10 U/L (ref 0–44)
AST: 19 U/L (ref 15–41)
Albumin: 2.7 g/dL — ABNORMAL LOW (ref 3.5–5.0)
Alkaline Phosphatase: 213 U/L — ABNORMAL HIGH (ref 38–126)
Anion gap: 10 (ref 5–15)
BUN: <5 mg/dL — ABNORMAL LOW (ref 6–20)
CO2: 19 mmol/L — ABNORMAL LOW (ref 22–32)
Calcium: 8.4 mg/dL — ABNORMAL LOW (ref 8.9–10.3)
Chloride: 106 mmol/L (ref 98–111)
Creatinine, Ser: 0.57 mg/dL (ref 0.44–1.00)
GFR, Estimated: >60 mL/min (ref >60)
Glucose, Bld: 80 mg/dL (ref 70–99)
Potassium: 3.4 mmol/L — ABNORMAL LOW (ref 3.5–5.1)
Sodium: 135 mmol/L (ref 135–145)
Total Bilirubin: 1.6 mg/dL — ABNORMAL HIGH (ref 0.3–1.2)
Total Protein: 5.9 g/dL — ABNORMAL LOW (ref 6.5–8.1)

## 2021-02-18 LAB — TYPE AND SCREEN
ABO/RH(D): O POS
Antibody Screen: NEGATIVE

## 2021-02-18 LAB — PROTEIN / CREATININE RATIO, URINE
Creatinine, Urine: 149.78 mg/dL
Protein Creatinine Ratio: 0.08 mg/mg{Cre} (ref 0.00–0.15)
Total Protein, Urine: 12 mg/dL

## 2021-02-18 LAB — RESP PANEL BY RT-PCR (FLU A&B, COVID) ARPGX2
Influenza A by PCR: NEGATIVE
Influenza B by PCR: NEGATIVE
SARS Coronavirus 2 by RT PCR: NEGATIVE

## 2021-02-18 LAB — AMNISURE RUPTURE OF MEMBRANE (ROM) NOT AT ARMC: Amnisure ROM: NEGATIVE

## 2021-02-18 MED ORDER — OXYCODONE-ACETAMINOPHEN 5-325 MG PO TABS: 2.0000 | ORAL_TABLET | ORAL | Status: DC | PRN

## 2021-02-18 MED ORDER — DIBUCAINE (PERIANAL) 1 % EX OINT: 1.0000 "application " | TOPICAL_OINTMENT | CUTANEOUS | Status: DC | PRN

## 2021-02-18 MED ORDER — LACTATED RINGERS IV SOLN: 500.0000 mL | INTRAVENOUS | Status: DC | PRN

## 2021-02-18 MED ORDER — BENZOCAINE-MENTHOL 20-0.5 % EX AERO
1.0000 "application " | INHALATION_SPRAY | CUTANEOUS | Status: DC | PRN
  Filled 2021-02-18: qty 56

## 2021-02-18 MED ORDER — IBUPROFEN 600 MG PO TABS
600.0000 mg | ORAL_TABLET | Freq: Four times a day (QID) | ORAL | Status: DC
  Administered 2021-02-19 – 2021-02-20 (×7): 600 mg via ORAL
  Filled 2021-02-18 (×8): qty 1

## 2021-02-18 MED ORDER — FENTANYL-BUPIVACAINE-NACL 0.5-0.125-0.9 MG/250ML-% EP SOLN
EPIDURAL | Status: DC | PRN
  Administered 2021-02-18: 12 mL/h via EPIDURAL

## 2021-02-18 MED ORDER — ONDANSETRON HCL 4 MG/2ML IJ SOLN
4.0000 mg | Freq: Once | INTRAMUSCULAR | Status: AC
  Administered 2021-02-18: 4 mg via INTRAVENOUS
  Filled 2021-02-18 (×2): qty 2

## 2021-02-18 MED ORDER — PHENYLEPHRINE 40 MCG/ML (10ML) SYRINGE FOR IV PUSH (FOR BLOOD PRESSURE SUPPORT): 80.0000 ug | PREFILLED_SYRINGE | INTRAVENOUS | Status: DC | PRN

## 2021-02-18 MED ORDER — OXYCODONE HCL 5 MG PO TABS: 5.0000 mg | ORAL_TABLET | ORAL | Status: DC | PRN

## 2021-02-18 MED ORDER — OXYCODONE HCL 5 MG PO TABS: 10.0000 mg | ORAL_TABLET | ORAL | Status: DC | PRN

## 2021-02-18 MED ORDER — EPHEDRINE 5 MG/ML INJ: 10.0000 mg | INTRAVENOUS | Status: DC | PRN

## 2021-02-18 MED ORDER — OXYTOCIN-SODIUM CHLORIDE 30-0.9 UT/500ML-% IV SOLN: 2.5000 [IU]/h | INTRAVENOUS | Status: DC | PRN

## 2021-02-18 MED ORDER — TERBUTALINE SULFATE 1 MG/ML IJ SOLN: 0.2500 mg | Freq: Once | INTRAMUSCULAR | Status: DC | PRN

## 2021-02-18 MED ORDER — FENTANYL-BUPIVACAINE-NACL 0.5-0.125-0.9 MG/250ML-% EP SOLN
12.0000 mL/h | EPIDURAL | Status: DC | PRN
  Filled 2021-02-18: qty 250

## 2021-02-18 MED ORDER — ONDANSETRON HCL 4 MG PO TABS: 4.0000 mg | ORAL_TABLET | ORAL | Status: DC | PRN

## 2021-02-18 MED ORDER — LACTATED RINGERS IV SOLN: 500.0000 mL | Freq: Once | INTRAVENOUS | Status: DC

## 2021-02-18 MED ORDER — WITCH HAZEL-GLYCERIN EX PADS: 1.0000 "application " | MEDICATED_PAD | CUTANEOUS | Status: DC | PRN

## 2021-02-18 MED ORDER — OXYCODONE-ACETAMINOPHEN 5-325 MG PO TABS: 1.0000 | ORAL_TABLET | ORAL | Status: DC | PRN

## 2021-02-18 MED ORDER — INFLUENZA VAC SPLIT QUAD 0.5 ML IM SUSY
0.5000 mL | PREFILLED_SYRINGE | INTRAMUSCULAR | Status: AC
  Administered 2021-02-19: 0.5 mL via INTRAMUSCULAR
  Filled 2021-02-18: qty 0.5

## 2021-02-18 MED ORDER — ONDANSETRON HCL 4 MG/2ML IJ SOLN: 4.0000 mg | Freq: Four times a day (QID) | INTRAMUSCULAR | Status: DC | PRN

## 2021-02-18 MED ORDER — ONDANSETRON HCL 4 MG/2ML IJ SOLN: 4.0000 mg | INTRAMUSCULAR | Status: DC | PRN

## 2021-02-18 MED ORDER — LIDOCAINE HCL (PF) 1 % IJ SOLN: 30.0000 mL | INTRAMUSCULAR | Status: DC | PRN

## 2021-02-18 MED ORDER — LIDOCAINE HCL (PF) 1 % IJ SOLN
INTRAMUSCULAR | Status: DC | PRN
  Administered 2021-02-18: 2 mL via EPIDURAL
  Administered 2021-02-18: 10 mL via EPIDURAL

## 2021-02-18 MED ORDER — DIPHENHYDRAMINE HCL 25 MG PO CAPS: 25.0000 mg | ORAL_CAPSULE | Freq: Four times a day (QID) | ORAL | Status: DC | PRN

## 2021-02-18 MED ORDER — COCONUT OIL OIL: 1.0000 "application " | TOPICAL_OIL | Status: DC | PRN

## 2021-02-18 MED ORDER — SIMETHICONE 80 MG PO CHEW: 80.0000 mg | CHEWABLE_TABLET | ORAL | Status: DC | PRN

## 2021-02-18 MED ORDER — PRENATAL MULTIVITAMIN CH
1.0000 | ORAL_TABLET | Freq: Every day | ORAL | Status: DC
  Administered 2021-02-19 – 2021-02-20 (×2): 1 via ORAL
  Filled 2021-02-18 (×2): qty 1

## 2021-02-18 MED ORDER — LACTATED RINGERS IV SOLN: INTRAVENOUS | Status: DC

## 2021-02-18 MED ORDER — ACETAMINOPHEN 325 MG PO TABS
650.0000 mg | ORAL_TABLET | ORAL | Status: DC | PRN
  Administered 2021-02-18: 650 mg via ORAL
  Filled 2021-02-18: qty 2

## 2021-02-18 MED ORDER — SOD CITRATE-CITRIC ACID 500-334 MG/5ML PO SOLN
30.0000 mL | ORAL | Status: DC | PRN
Start: 2021-02-18 — End: 2021-02-18

## 2021-02-18 MED ORDER — OXYTOCIN-SODIUM CHLORIDE 30-0.9 UT/500ML-% IV SOLN
2.5000 [IU]/h | INTRAVENOUS | Status: DC
  Administered 2021-02-18: 2.5 [IU]/h via INTRAVENOUS
  Filled 2021-02-18: qty 500

## 2021-02-18 MED ORDER — OXYTOCIN-SODIUM CHLORIDE 30-0.9 UT/500ML-% IV SOLN
1.0000 m[IU]/min | INTRAVENOUS | Status: DC
  Administered 2021-02-18: 2 m[IU]/min via INTRAVENOUS

## 2021-02-18 MED ORDER — SENNOSIDES-DOCUSATE SODIUM 8.6-50 MG PO TABS
2.0000 | ORAL_TABLET | Freq: Every day | ORAL | Status: DC
  Administered 2021-02-19 – 2021-02-20 (×2): 2 via ORAL
  Filled 2021-02-18 (×2): qty 2

## 2021-02-18 MED ORDER — OXYTOCIN BOLUS FROM INFUSION
333.0000 mL | Freq: Once | INTRAVENOUS | Status: AC
  Administered 2021-02-18: 333 mL via INTRAVENOUS

## 2021-02-18 MED ORDER — ZOLPIDEM TARTRATE 5 MG PO TABS: 5.0000 mg | ORAL_TABLET | Freq: Every evening | ORAL | Status: DC | PRN

## 2021-02-18 MED ORDER — DIPHENHYDRAMINE HCL 50 MG/ML IJ SOLN: 12.5000 mg | INTRAMUSCULAR | Status: DC | PRN

## 2021-02-18 MED ORDER — FLEET ENEMA 7-19 GM/118ML RE ENEM: 1.0000 | ENEMA | RECTAL | Status: DC | PRN

## 2021-02-18 MED ORDER — ACETAMINOPHEN 325 MG PO TABS: 650.0000 mg | ORAL_TABLET | ORAL | Status: DC | PRN

## 2021-02-18 MED ORDER — TETANUS-DIPHTH-ACELL PERTUSSIS 5-2.5-18.5 LF-MCG/0.5 IM SUSY: 0.5000 mL | PREFILLED_SYRINGE | Freq: Once | INTRAMUSCULAR | Status: DC

## 2021-02-18 NOTE — Anesthesia Preprocedure Evaluation (Signed)
Anesthesia Evaluation  Patient identified by MRN, date of birth, ID band Patient awake    Reviewed: Allergy & Precautions, Patient's Chart, lab work & pertinent test results  Airway Mallampati: II  TM Distance: >3 FB Neck ROM: Full    Dental no notable dental hx.    Pulmonary former smoker,  Quit smoking 2016   Pulmonary exam normal breath sounds clear to auscultation       Cardiovascular negative cardio ROS Normal cardiovascular exam Rhythm:Regular Rate:Normal     Neuro/Psych PSYCHIATRIC DISORDERS Anxiety Depression negative neurological ROS     GI/Hepatic negative GI ROS, Neg liver ROS,   Endo/Other  Obesity BMI 32  Renal/GU negative Renal ROS  negative genitourinary   Musculoskeletal negative musculoskeletal ROS (+)   Abdominal   Peds negative pediatric ROS (+)  Hematology negative hematology ROS (+) hct 32.2, 208   Anesthesia Other Findings   Reproductive/Obstetrics (+) Pregnancy                             Anesthesia Physical Anesthesia Plan  ASA: 2  Anesthesia Plan: Epidural   Post-op Pain Management:    Induction:   PONV Risk Score and Plan: 2  Airway Management Planned: Natural Airway  Additional Equipment: None  Intra-op Plan:   Post-operative Plan:   Informed Consent: I have reviewed the patients History and Physical, chart, labs and discussed the procedure including the risks, benefits and alternatives for the proposed anesthesia with the patient or authorized representative who has indicated his/her understanding and acceptance.       Plan Discussed with:   Anesthesia Plan Comments:         Anesthesia Quick Evaluation

## 2021-02-18 NOTE — H&P (Signed)
MD HISTORY AND PHYSICAL (late entry)   Christine Ross is a 30 y.o. female admitted from MAU for SROM.   G4 P1-0-2-1 at 39 weeks and 5 days presented to MAU with c/o  worsening contractions and leaking of clear fluid.  Her contractions were intermittent at night, but became more consistent with increasing intensity since about 730 this morning.  When she stood up, she did fluid that makes down her leg, which was new for her.  Her contractions have increased and have become more frequent.  No vaginal bleeding and she report good fetal movement.   Prenatal Problem List: Anxiety / Depression - no meds Chronic Headaches Vitamin D Deficiency GERD Eczema H/O of Recurrent miscarriage  OB History     Gravida  4   Para  1   Term  1   Preterm  0   AB  2   Living  1      SAB  2   IAB  0   Ectopic  0   Multiple  0   Live Births  1          Past Medical History:  Diagnosis Date   Anxiety    Chlamydia contact, treated 02-2014   Chlamydial cervicitis 02/28/2014   Treated.    Depression    age 30yo, resolved   Eczema    GC (gonococcus) 02/28/2014   Sexual contact.  Negative culture, per patient.  Treated for contact.    Gonorrhea    Insomnia    Routine gynecological examination    Dr. Jean Rosenthal   Wears glasses    Past Surgical History:  Procedure Laterality Date   DILATION AND EVACUATION N/A 06/29/2014   Procedure: DILATATION AND EVACUATION;  Surgeon: Brock Bad, MD;  Location: [facility];  Service: Gynecology;  Laterality: N/A;   DILATION AND EVACUATION N/A 08/16/2014   Procedure: DILATATION AND EVACUATION;  Surgeon: Maxie Better, MD;  Location: [facility];  Service: Gynecology;  Laterality: N/A;   OPERATIVE ULTRASOUND N/A 08/16/2014   Procedure: OPERATIVE ULTRASOUND;  Surgeon: Maxie Better, MD;  Location: [facility];  Service: Gynecology;  Laterality: N/A;   WISDOM TOOTH EXTRACTION     Family History: family history includes Diabetes in her maternal  grandmother, mother, and paternal grandmother. Social History:  reports that she quit smoking about 5 years ago. Her smoking use included cigars. She has never used smokeless tobacco. She reports that she does not currently use alcohol. She reports that she does not currently use drugs after having used the following drugs: Marijuana. Frequency: 7.00 times per week.     Maternal Diabetes: No Genetic Screening: Normal Maternal Ultrasounds/Referrals: Normal Fetal Ultrasounds or other Referrals:  None Maternal Substance Abuse:  No Significant Maternal Medications:  None Significant Maternal Lab Results:  Group B Strep negative Other Comments:  None  Review of Systems  All other systems reviewed and are negative. as per HPI  Physical Exam on Admission (done in MAU)    Blood pressure (!) 140/102, pulse (!) 101, temperature 98.3 F (36.8 C), temperature source Oral, resp. rate 18, last menstrual period 05/14/2020, SpO2 100 %. Patient Vitals for the past 24 hrs:   BP Temp Temp src Pulse Resp SpO2  02/18/21 0900 (!) 140/102 -- -- (!) 101 -- --  02/18/21 0846 131/77 -- -- 93 -- --  02/18/21 0827 (!) 148/99 98.3 F (36.8 C) Oral 97 18 100 %        Physical Exam Vitals reviewed.  Constitutional:      Appearance: Normal appearance.  HENT:     Head: Normocephalic and atraumatic.  Cardiovascular:     Rate and Rhythm: Normal rate and regular rhythm.     Pulses: Normal pulses.     Heart sounds: Normal heart sounds.  Pulmonary:     Effort: Pulmonary effort is normal.  Abdominal:     General: Abdomen is flat. There is no distension.     Palpations: Abdomen is soft.     Tenderness: There is no abdominal tenderness.  Skin:    Capillary Refill: Capillary refill takes less than 2 seconds.  Neurological:     General: No focal deficit present.     Mental Status: She is alert and oriented to person, place, and time.  Psychiatric:        Mood and Affect: Mood normal.        Behavior:  Behavior normal.        Thought Content: Thought content normal.        Judgment: Judgment normal.   Cervical Exam on Admission (done by RN):  Dilation: 3.5 Effacement (%): 90 Station: 0 Grossly ruptured, clear fluid  Fetal Heart Tracing: Category I Baseline: 120-130's  Variability: moderate Accelerations: present Decelerations: none Contractions: every 2-3 minutes  Last Growth [place] in Office: 01/08/21 [place] Pregnancy. Vertex Presentation. Anterior Placenta. Cervix Not Seen Per Protocol. Amniotic Fluid Appears Normal FI=13.4CM. EFW= 2990g, 6 POUNDS 9 OZ, >98%.   Prenatal labs: ABO, Rh: --/--/O POS (09/26 9604) Antibody: NEG (09/26 5409) Rubella: Immune (02/15 0000) RPR: Nonreactive (07/19 0000)  HBsAg: Negative (02/15 0000)  HIV: Non-reactive (02/15 0000)  GBS: Negative/-- (09/01 0000)   Assessment/Plan: 31 year old G4P1021 at 39 weeks 5 days in early/active labor s/p SROM Admit to Labor and Delivery Continuous monitoring IV hydration Epidural on demand Admission labs - BP's elevated in MAU - Pre E labs within normal limits - possible gestational hypertension - presentation not c/w preeclampsia IV pitocin prn augmentation Anticipate NSVD    German Manke 02/18/2021, 3:00 PM

## 2021-02-18 NOTE — Lactation Note (Signed)
This note was copied from a baby's chart. Lactation Consultation Note  Patient Name: Girl Princessa [place] Today's Date: 02/18/2021 Reason for consult: Mother's request;Difficult latch Age:29 hours  LC in to room per mother's request. Mother reports feeding baby some formula but baby does not like it. Baby is skin to skin with father upon arrival.  Mother states nipples are very sore and she is using some coconut oil for relief. Inquired using hand pump, mother explains it is painful. LC changed flanges, per mother, it feels better, good flow but milk is blood-tinged.  Infant is displaying cues, LC feed ~12-mL of formula while pacing, upright position and burping frequently. Noted infant's suck is disorganized, lack of seal around nipple, seems to be chewing at times.  Mother would like to switch to donor milk.   Maternal Data Has patient been taught Hand Expression?: Yes  Feeding Mother's Current Feeding Choice: Breast Milk and Formula Nipple Type: Slow - flow  Lactation Tools Discussed/Used Tools: Pump;Flanges Flange Size: 27 Breast pump type: Manual Reason for Pumping: stimulation and supplementation Pumping frequency: as needed Pumped volume: 2 mL  Interventions Interventions: Hand express;Breast massage;Hand pump;Expressed milk;Coconut oil;Education;Pace feeding;Breast feeding basics reviewed   Consult Status Consult Status: Follow-up Date: 02/19/21 Follow-up type: In-patient    [place] Kreitzer A Higuera Ancidey 02/18/2021, 10:58 PM

## 2021-02-18 NOTE — Plan of Care (Signed)
Pt demonstrated understanding 

## 2021-02-18 NOTE — Anesthesia Procedure Notes (Signed)
Epidural Patient location during procedure: OB Start time: 02/18/2021 10:24 AM End time: 02/18/2021 10:32 AM  Staffing Anesthesiologist: Lannie Fields, DO Performed: anesthesiologist   Preanesthetic Checklist Completed: patient identified, IV checked, risks and benefits discussed, monitors and equipment checked, pre-op evaluation and timeout performed  Epidural Patient position: sitting Prep: DuraPrep and site prepped and draped Patient monitoring: continuous pulse ox, blood pressure, heart rate and cardiac monitor Approach: midline Location: L3-L4 Injection technique: LOR air  Needle:  Needle type: Tuohy  Needle gauge: 17 G Needle length: 9 cm Needle insertion depth: 4.5 cm Catheter type: closed end flexible Catheter size: 19 Gauge Catheter at skin depth: 9 cm Test dose: negative  Assessment Sensory level: T8 Events: blood not aspirated, injection not painful, no injection resistance, no paresthesia and negative IV test  Additional Notes Patient identified. Risks/Benefits/Options discussed with patient including but not limited to bleeding, infection, nerve damage, paralysis, failed block, incomplete pain control, headache, blood pressure changes, nausea, vomiting, reactions to medication both or allergic, itching and postpartum back pain. Confirmed with bedside nurse the patient's most recent platelet count. Confirmed with patient that they are not currently taking any anticoagulation, have any bleeding history or any family history of bleeding disorders. Patient expressed understanding and wished to proceed. All questions were answered. Sterile technique was used throughout the entire procedure. Please see nursing notes for vital signs. Test dose was given through epidural catheter and negative prior to continuing to dose epidural or start infusion. Warning signs of high block given to the patient including shortness of breath, tingling/numbness in hands, complete motor  block, or any concerning symptoms with instructions to call for help. Patient was given instructions on fall risk and not to get out of bed. All questions and concerns addressed with instructions to call with any issues or inadequate analgesia.  Reason for block:procedure for pain

## 2021-02-18 NOTE — Lactation Note (Signed)
This note was copied from Christine baby's chart. Lactation Consultation Note  Patient Name: Christine Ross Today's Date: 02/18/2021 Reason for consult: Initial assessment;Term Age:29 hours  Initial visit to 3 hours old infant of Christine P2 mother. LC assisted with latch. Demonstrated alignment, support pillows, and hand expression. Noted bloody colostrum upon expression. "Christine Ross" latched at once, still breastfeeding upon LC leaving room.  Discussed normal newborn behavior and patterns, signs of good milk transfer, hunger cues, tummy size and benefits of skin to skin.   Plan: 1-Deep, comfortable latch and breastfeeding on demand or 8-12 times in 24h period. 2-Encouraged maternal rest, hydration and food intake.  3-Contact LC as needed for feeds/support/concerns/questions   All questions answered at this time. Provided Lactation services brochure and promoted [employer] booklet information.     Maternal Data Has patient been taught Hand Expression?: Yes Does the patient have breastfeeding experience prior to this delivery?: Yes How long did the patient breastfeed?: ~4 weeks  Feeding Mother's Current Feeding Choice: Breast Milk and Formula  LATCH Score Latch: Grasps breast easily, tongue down, lips flanged, rhythmical sucking.  Audible Swallowing: Spontaneous and intermittent  Type of Nipple: Everted at rest and after stimulation  Comfort (Breast/Nipple): Soft / non-tender  Hold (Positioning): Assistance needed to correctly position infant at breast and maintain latch.  LATCH Score: 9  Interventions Interventions: Breast feeding basics reviewed;Assisted with latch;Skin to skin;Breast massage;Hand express;Adjust position;Support pillows;Education;Expressed milk;Position options  Discharge Pump: Personal WIC Program: No  Consult Status Consult Status: Follow-up Date: 02/19/21 Follow-up type: In-patient    Christine Ross Christine Ross 02/18/2021, 6:02 PM

## 2021-02-18 NOTE — MAU Note (Signed)
Christine Ross is a 30 y.o. at [redacted]w[redacted]d here in MAU reporting: contractions got worse this AM and they are back to back. Also reporting LOF since this AM, it is clear. States it ran down her leg.  Onset of complaint: today  Pain score: 6/10  Vitals:   02/18/21 0827  BP: (!) 148/99  Pulse: 97  Resp: 18  Temp: 98.3 F (36.8 C)  SpO2: 100%     FHT: EFM applied in room  Lab orders placed from triage: none

## 2021-02-18 NOTE — Lactation Note (Signed)
This note was copied from a baby's chart. Lactation Consultation Note  Patient Name: Christine Ross Today's Date: 02/18/2021 Reason for consult: L&D Initial assessment;Mother's request;Term Age: < 1 hr  LC assisted with latching infant in football with signs of milk transfer.  Mom denied any pain with the latch. Mom states plan to EBM for now.  All questions answered at the end of the visit.   Maternal Data Has patient been taught Hand Expression?: Yes Does the patient have breastfeeding experience prior to this delivery?: Yes How long did the patient breastfeed?: Infant had a tongue tie not diagnosed until late, was not able to obtain a latch  Feeding Mother's Current Feeding Choice: Breast Milk  LATCH Score Latch: Repeated attempts needed to sustain latch, nipple held in mouth throughout feeding, stimulation needed to elicit sucking reflex.  Audible Swallowing: Spontaneous and intermittent  Type of Nipple: Everted at rest and after stimulation  Comfort (Breast/Nipple): Soft / non-tender  Hold (Positioning): Assistance needed to correctly position infant at breast and maintain latch.  LATCH Score: 8   Lactation Tools Discussed/Used    Interventions Interventions: Breast feeding basics reviewed;Breast compression;Assisted with latch;Adjust position;Skin to skin;Support pillows;Breast massage;Position options;Hand express;Expressed milk;Education  Discharge    Consult Status Consult Status: Follow-up from L&D Date: 02/19/21 Follow-up type: In-patient    Christine Ross  Christine Ross 02/18/2021, 3:45 PM

## 2021-02-18 NOTE — MAU Provider Note (Signed)
History     CSN: [id number]  Arrival date and time: 02/18/21 2423   Event Date/Time   First Provider Initiated Contact with Patient 02/18/21 [phone]      Chief Complaint  Patient presents with   Contractions   Rupture of Membranes   HPI This is a 30 year old G4 P1-0-2-1 at 39 weeks and 5 days who presents with contractions fluid.  Her contractions were intermittent at night, but became more consistent with increasing intensity since about 730 this morning.  When she stood up, she did fluid that makes down her leg, which was new for her.  Her contractions have increased and have become more frequent.  No palliating or provoking factors.  OB History     Gravida  4   Para  1   Term  1   Preterm  0   AB  2   Living  1      SAB  2   IAB  0   Ectopic  0   Multiple  0   Live Births  1           Past Medical History:  Diagnosis Date   Anxiety    Chlamydia contact, treated 02-2014   Chlamydial cervicitis 02/28/2014   Treated.    Depression    age 63yo, resolved   Eczema    GC (gonococcus) 02/28/2014   Sexual contact.  Negative culture, per patient.  Treated for contact.    Gonorrhea    Insomnia    Routine gynecological examination    Dr. Jean Rosenthal   Wears glasses     Past Surgical History:  Procedure Laterality Date   DILATION AND EVACUATION N/A 06/29/2014   Procedure: DILATATION AND EVACUATION;  Surgeon: Brock Bad, MD;  Location: [facility];  Service: Gynecology;  Laterality: N/A;   DILATION AND EVACUATION N/A 08/16/2014   Procedure: DILATATION AND EVACUATION;  Surgeon: Maxie Better, MD;  Location: [facility];  Service: Gynecology;  Laterality: N/A;   OPERATIVE ULTRASOUND N/A 08/16/2014   Procedure: OPERATIVE ULTRASOUND;  Surgeon: Maxie Better, MD;  Location: [facility];  Service: Gynecology;  Laterality: N/A;   WISDOM TOOTH EXTRACTION      Family History  Problem Relation Age of Onset   Diabetes Mother    Diabetes Maternal Grandmother     Diabetes Paternal Grandmother     Social History   Tobacco Use   Smoking status: Former    Types: Cigars    Quit date: 02/24/2015    Years since quitting: 5.9   Smokeless tobacco: Never  Vaping Use   Vaping Use: Never used  Substance Use Topics   Alcohol use: Not Currently    Comment: social   Drug use: Not Currently    Frequency: 7.0 times per week    Types: Marijuana    Comment: last used yesterday    Allergies:  Allergies  Allergen Reactions   Nickel Rash    No medications prior to admission.    Review of Systems Physical Exam   Blood pressure (!) 140/102, pulse (!) 101, temperature 98.3 F (36.8 C), temperature source Oral, resp. rate 18, last menstrual period 05/14/2020, SpO2 100 %. Patient Vitals for the past 24 hrs:  BP Temp Temp src Pulse Resp SpO2  02/18/21 0900 (!) 140/102 -- -- (!) 101 -- --  02/18/21 0846 131/77 -- -- 93 -- --  02/18/21 0827 (!) 148/99 98.3 F (36.8 C) Oral 97 18 100 %     Physical  Exam Vitals reviewed.  Constitutional:      Appearance: Normal appearance.  HENT:     Head: Normocephalic and atraumatic.  Cardiovascular:     Rate and Rhythm: Normal rate and regular rhythm.     Pulses: Normal pulses.     Heart sounds: Normal heart sounds.  Pulmonary:     Effort: Pulmonary effort is normal.  Abdominal:     General: Abdomen is flat. There is no distension.     Palpations: Abdomen is soft.     Tenderness: There is no abdominal tenderness.  Skin:    Capillary Refill: Capillary refill takes less than 2 seconds.  Neurological:     General: No focal deficit present.     Mental Status: She is alert and oriented to person, place, and time.  Psychiatric:        Mood and Affect: Mood normal.        Behavior: Behavior normal.        Thought Content: Thought content normal.        Judgment: Judgment normal.   Results for orders placed or performed during the hospital encounter of 02/18/21 (from the past 24 hour(s))  Fern Test      Status: None   Collection Time: 02/18/21  8:30 AM  Result Value Ref Range   POCT Fern Test Negative = intact amniotic membranes      MAU Course  Procedures NST:  Baseline: 120-130  Variability: mod Accelerations: +  Decelerations: none Contractions: every 2-3 minutes  MDM BP elevated: check labs  Assessment and Plan  SROM Possible GHTN/Pree  Admit.  Levie Heritage 02/18/2021, 9:07 AM

## 2021-02-19 LAB — CBC
HCT: 31.4 % — ABNORMAL LOW (ref 36.0–46.0)
Hemoglobin: 10.6 g/dL — ABNORMAL LOW (ref 12.0–15.0)
MCH: 28.8 pg (ref 26.0–34.0)
MCHC: 33.8 g/dL (ref 30.0–36.0)
MCV: 85.3 fL (ref 80.0–100.0)
Platelets: 206 10*3/uL (ref 150–400)
RBC: 3.68 MIL/uL — ABNORMAL LOW (ref 3.87–5.11)
RDW: 13.5 % (ref 11.5–15.5)
WBC: 7.4 10*3/uL (ref 4.0–10.5)
nRBC: 0 % (ref 0.0–0.2)

## 2021-02-19 LAB — RPR: RPR Ser Ql: NONREACTIVE

## 2021-02-19 NOTE — Social Work (Signed)
CSW received consult for hx of Anxiety and Depression.  CSW met with MOB to offer support and complete assessment.    CSW met with MOB at bedside and introduced CSW role. CSW observed MOB consoling the crying infant and then breastfeeding. CSW congratulated MOB. MOB presented calm and welcomed CSW. CSW inquired how MOB has felt since giving birth. MOB expressed feeling good and shared the baby "got stuck" during the birth. CSW praised MOB for her efforts. CSW inquired about MOB history of anxiety and depression. MOB disclosed that she was diagnosed with anxiety and depression in 2021. MOB shared her anxiety and depression was triggered by a situation that has been resolved. MOB shared she was previously in therapy and prescribed medication for symptoms however she did not take the medication. MOB shared the therapy was helpful. MOB reported no recent concerns with anxiety and depression. CSW inquired about MOB coping mechanisms. MOB shared she stays busy. CSW inquired if MOB experienced postpartum depression after the birth of her older child. MOB reported has not experienced postpartum depression. CSW provided education regarding the baby blues period vs. perinatal mood disorders, discussed treatment and gave resources for mental health follow up if concerns arise.  CSW recommended MOB complete a self-evaluation during the postpartum time period using the New Mom Checklist from Postpartum Progress and encouraged MOB to contact a medical professional if symptoms are noted at any time.    CSW provided review of Sudden Infant Death Syndrome (SIDS) precautions. MOB reported she has essential items for the infant including a crib where the infant will sleep. MOB to decide on a pediatrician for the infant's follow up. CSW assessed MOB for additional needs. MOB reported no further need.   CSW identifies no further need for intervention and no barriers to discharge at this time.   Christine Ross, MSW,  LCSW [facility]  Clinical Social Worker  [phone] 02/19/2021  12:36 PM  

## 2021-02-19 NOTE — Progress Notes (Signed)
PPD# 1 Vacuum assisted vaginal delivery Information for the patient's newborn:  [place], Girl Aryianna [[id number]]  female     S:   Reports feeling good Tolerating PO fluid and solids No nausea or vomiting Bleeding is moderate Pain controlled with  PO meds Up ad lib / ambulatory / voiding w/o difficulty Feeding: Breast    O:   VS: BP 116/73 (BP Location: Right Arm)   Pulse 67   Temp 98.6 F (37 C) (Oral)   Resp 19   Ht 5' (1.524 m)   Wt 73 kg   LMP 05/14/2020   SpO2 100%   Breastfeeding Unknown   BMI 31.44 kg/m   LABS:  Recent Labs    02/18/21 0904 02/19/21 0458  WBC 5.6 7.4  HGB 10.8* 10.6*  PLT 208 206   Blood type: --/--/O POS (09/26 0904) Rubella: Immune (02/15 0000)                      I&O: Intake/Output      09/26 0701 09/27 0700 09/27 0701 09/28 0700   I.V. (mL/kg) 0 (0)    Other 0    Total Intake(mL/kg) 0 (0)    Urine (mL/kg/hr) 100    Blood 50    Total Output 150    Net -150         Urine Occurrence 1 x      Physical Exam: Alert and oriented X3 Lungs: Clear and unlabored Heart: regular rate and rhythm / no mumurs Abdomen: soft, non-tender, non-distended  Fundus: firm, non-tender, U-2 Perineum: intact Lochia: moderate Extremities: no edema, no calf pain, tenderness, or cords    A:  PPD # 1  Normal exam  P:  Routine post partum orders Anticipate D/C on tomorrow   Plan reviewed w/ Dr. Derrek Monaco, MSN, CNM 02/19/2021, 3:26 PM

## 2021-02-19 NOTE — Lactation Note (Signed)
This note was copied from a baby's chart. Lactation Consultation Note  Patient Name: Christine Ross Today's Date: 02/19/2021   Age:29 hours LC checked in with RN, Dixie Dials to see how feeding going. RN states mom latching as well as supplementing with DBM.  LC asked RN to ensure Mom using DEBP post pumping q 3hrs after latching to maintain her milk supply as she supplements.   Maternal Data    Feeding    LATCH Score                    Lactation Tools Discussed/Used    Interventions    Discharge    Consult Status      Christine Whitby  Ross 02/19/2021, 11:01 PM

## 2021-02-19 NOTE — Anesthesia Postprocedure Evaluation (Signed)
Anesthesia Post Note  Patient: Christine Ross  Procedure(s) Performed: AN AD HOC LABOR EPIDURAL     Patient location during evaluation: Mother Baby Anesthesia Type: Epidural Level of consciousness: awake and alert Pain management: pain level controlled Vital Signs Assessment: post-procedure vital signs reviewed and stable Respiratory status: spontaneous breathing, nonlabored ventilation and respiratory function stable Cardiovascular status: stable Postop Assessment: no headache, no backache, epidural receding, no apparent nausea or vomiting, patient able to bend at knees, adequate PO intake and able to ambulate Anesthetic complications: no   No notable events documented.  Last Vitals:  Vitals:   02/19/21 0155 02/19/21 0546  BP: 114/66 111/66  Pulse: 68 78  Resp:    Temp:  36.9 C  SpO2:  99%    Last Pain:  Vitals:   02/19/21 0710  TempSrc:   PainSc: 0-No pain   Pain Goal:                   [place]

## 2021-02-20 MED ORDER — ACETAMINOPHEN 325 MG PO TABS: 650.0000 mg | ORAL_TABLET | ORAL | Status: AC | PRN

## 2021-02-20 MED ORDER — IBUPROFEN 600 MG PO TABS: 600.0000 mg | ORAL_TABLET | Freq: Four times a day (QID) | ORAL | 0 refills | Status: AC

## 2021-02-20 NOTE — Discharge Summary (Signed)
Postpartum Discharge Summary  Date of Service updated 02/20/21    Patient Name: Christine Ross DOB: [birth year] MRN: [id number]  Date of admission: 02/18/2021 Delivery date:02/18/2021  Delivering provider: Sanjuana Kava  Date of discharge: 02/20/2021  Admitting diagnosis: Normal labor [O80, Z37.9] Intrauterine pregnancy: [redacted]w[redacted]d    Secondary diagnosis:  Principal Problem:   Vacuum-assisted vaginal delivery Active Problems:   Normal labor  Additional problems: none    Discharge diagnosis: Term Pregnancy Delivered                                              Post partum procedures: none Augmentation: Pitocin Complications: None  Hospital course: Onset of Labor With Vaginal Delivery      30y.o. yo [id number] admitted in Latent Labor on 02/18/2021. Patient had an uncomplicated labor course as follows:  Membrane Rupture Time/Date: 9:20 AM ,02/18/2021   Delivery Method:Vaginal, Spontaneous  Episiotomy: None  Lacerations:  None  Patient had an uncomplicated postpartum course.  She is ambulating, tolerating a regular diet, passing flatus, and urinating well. Patient is discharged home in stable condition on 02/20/21.  Newborn Data: Birth date:02/18/2021  Birth time:2:43 PM  Gender:Female  Living status:Living  Apgars:8 ,9  Weight:3360 g   Magnesium Sulfate received: No BMZ received: No Rhophylac:N/A MMR:N/A Transfusion:No  Physical exam  Vitals:   02/19/21 0546 02/19/21 1406 02/19/21 2130 02/20/21 0553  BP: 111/66 116/73 124/79 128/79  Pulse: 78 67 69 70  Resp:  _0 Temp: 98.4 F (36.9 C) 98.6 F (37 C) 98.1 F (36.7 C) (!) 97.3 F (36.3 C)  TempSrc:  Oral Oral Oral  SpO2: 99% 100% 100% 100%  Weight:      Height:       General: alert, cooperative, and no distress Lochia: appropriate Uterine Fundus: firm Incision: N/A DVT Evaluation: No evidence of DVT seen on physical exam. No cords or calf tenderness. No significant calf/ankle  edema. Labs: Lab Results  Component Value Date   WBC 7.4 02/19/2021   HGB 10.6 (L) 02/19/2021   HCT 31.4 (L) 02/19/2021   MCV 85.3 02/19/2021   PLT 206 02/19/2021   CMP Latest Ref Rng & Units 02/18/2021  Glucose 70 - 99 mg/dL 80  BUN 6 - 20 mg/dL <5(L)  Creatinine 0.44 - 1.00 mg/dL 0.57  Sodium 135 - 145 mmol/L 135  Potassium 3.5 - 5.1 mmol/L 3.4(L)  Chloride 98 - 111 mmol/L 106  CO2 22 - 32 mmol/L 19(L)  Calcium 8.9 - 10.3 mg/dL 8.4(L)  Total Protein 6.5 - 8.1 g/dL 5.9(L)  Total Bilirubin 0.3 - 1.2 mg/dL 1.6(H)  Alkaline Phos 38 - 126 U/L 213(H)  AST 15 - 41 U/L 19  ALT 0 - 44 U/L 10   Edinburgh Score: Edinburgh Postnatal Depression Scale Screening Tool 02/19/2021  I have been able to laugh and see the funny side of things. 0  I have looked forward with enjoyment to things. 0  I have blamed myself unnecessarily when things went wrong. 1  I have been anxious or worried for no good reason. 2  I have felt scared or panicky for no good reason. 1  Things have been getting on top of me. 0  I have been so unhappy that I have had difficulty sleeping. 1  I have felt sad or miserable. 0  I have been so unhappy that I have been crying. 1  The thought of harming myself has occurred to me. 0  Edinburgh Postnatal Depression Scale Total 6      After visit meds:  Allergies as of 02/20/2021       Reactions   Nickel Rash        Medication List     TAKE these medications    acetaminophen 325 MG tablet Commonly known as: Tylenol Take 2 tablets (650 mg total) by mouth every 4 (four) hours as needed (for pain scale < 4).   ibuprofen 600 MG tablet Commonly known as: ADVIL Take 1 tablet (600 mg total) by mouth every 6 (six) hours.               Durable Medical Equipment  (From admission, onward)           Start     Ordered   02/20/21 1120  For home use only DME double electric breast pump  Once       Comments: Z39.1   02/20/21 1120              Discharge home in stable condition Infant Feeding: Breast Infant Disposition:home with mother Discharge instruction: per After Visit Summary and Postpartum booklet. Activity: Advance as tolerated. Pelvic rest for 6 weeks.  Diet: routine diet Anticipated Birth Control: Unsure and Discussed all options, previously used Nexplanon and considering permanent sterilization Postpartum Appointment:6 weeks Additional Postpartum F/U:  none Future Appointments:No future appointments. Follow up Visit:  [facility] Follow up in 6 week(s).   Specialty: Obstetrics and Gynecology Contact information: [address] [place] [address] [phone]                    02/20/2021 Arrie Eastern, CNM

## 2021-02-20 NOTE — Lactation Note (Addendum)
This note was copied from a baby's chart. Lactation Consultation Note  Patient Name: Girl Rhyan [place] Today's Date: 02/20/2021 Reason for consult: Follow-up assessment;Difficult latch;Nipple pain/trauma;Term Age:29 hours  LC in to assist/assess with P2 Mom of term baby.  4% weight loss and slight jaundice noted, serum bili pending. Mom tried to breastfeed her first baby but he was "tongue-tied" and she switched to formula. Mom really wants to breastfeed this baby.  Mom's nipples are sore and cracking noted at base of nipples.  Mom was encouraged to pump to support her milk supply last evening, but she was thinking it was using the manual pump and she was too sore.  Baby sucking on pacifier while swaddled in crib. Due to trauma on nipples, oral assessment done.  Baby's tongue remains at floor of mouth when she is crying.  Probable posterior lingual short frenulum.  On suck assessment, baby unable to maintain suction on finger.  Initiated a 24 mm nipple shield, with return demo by Mom done.  Mom needing guidance on how to properly place shield to pull nipple into it. Baby able to latch and maintain consistent sucking/swallowing for 20 mins.  Encouraged Mom repeatedly to support her breast firmly as baby tends to push nipple out of her mouth.  Mom repeatedly moved her hands onto top of breast and pushing down on breast.    LC set up DEBP and assisted Mom to pump using 27 mm flanges.  Mom expressed >30 ml.   After baby came off the breast, baby still fussy, FOB feeding baby supplement of 30 ml of formula.   Mom has a hand's free pump at home.  Mom is interested in obtaining a STORK pump as she hadn't requested an insurance pump yet.  RN aware of this and will complete process.  Mom very tired.  Explained importance of frequent pumping whenever baby is supplemented.  Talked to Mom about OP lactation F/U and Mom is interested in this.  On discharge, a message to OP lactation needs to be  sent.    Feeding Nipple Type: Other ([place])  LATCH Score Latch: Repeated attempts needed to sustain latch, nipple held in mouth throughout feeding, stimulation needed to elicit sucking reflex.  Audible Swallowing: Spontaneous and intermittent  Type of Nipple: Everted at rest and after stimulation (large diameter and short nipple shafts)  Comfort (Breast/Nipple): Engorged, cracked, bleeding, large blisters, severe discomfort  Hold (Positioning): Assistance needed to correctly position infant at breast and maintain latch.  LATCH Score: 6   Lactation Tools Discussed/Used Tools: Nipple Shields;Pump;Bottle;Coconut oil;Flanges;Shells Nipple shield size: 24 Flange Size: 27 Breast pump type: Double-Electric Breast Pump Pump Education: Setup, frequency, and cleaning;Milk Storage Reason for Pumping: Support milk supply/nipple shield use Pumping frequency: Encouraged to pump after each feeding at the breast  Interventions Interventions: Breast feeding basics reviewed;Assisted with latch;Skin to skin;Breast massage;Hand express;Pre-pump if needed;Breast compression;Adjust position;Support pillows;Position options;Coconut oil;Shells;Hand pump;DEBP;Education  Discharge Discharge Education: Engorgement and breast care;Outpatient recommendation Pump: Personal;Stork Pump (hand's free Mom Cozy double pump) WIC Program: No  Consult Status Consult Status: Follow-up Date: 02/21/21 Follow-up type: In-patient    Judee Clara 02/20/2021, 11:24 AM

## 2021-02-23 ENCOUNTER — Encounter (HOSPITAL_COMMUNITY): Payer: Self-pay | Admitting: Obstetrics and Gynecology

## 2021-02-23 ENCOUNTER — Inpatient Hospital Stay (HOSPITAL_COMMUNITY): Payer: BC Managed Care – PPO

## 2021-02-23 ENCOUNTER — Other Ambulatory Visit: Payer: Self-pay

## 2021-02-23 ENCOUNTER — Encounter (HOSPITAL_COMMUNITY): Payer: Self-pay | Admitting: Obstetrics & Gynecology

## 2021-02-23 ENCOUNTER — Inpatient Hospital Stay (HOSPITAL_COMMUNITY)
Admission: AD | Admit: 2021-02-23 | Discharge: 2021-02-26 | DRG: 776 | Disposition: A | Discharge status: Home / Self Care | Payer: BC Managed Care – PPO | Attending: Obstetrics & Gynecology | Admitting: Obstetrics & Gynecology

## 2021-02-23 DIAGNOSIS — R030 Elevated blood-pressure reading, without diagnosis of hypertension: Secondary | ICD-10-CM | POA: Diagnosis present

## 2021-02-23 DIAGNOSIS — Z87891 Personal history of nicotine dependence: Secondary | ICD-10-CM | POA: Diagnosis not present

## 2021-02-23 DIAGNOSIS — O99893 Other specified diseases and conditions complicating puerperium: Secondary | ICD-10-CM | POA: Diagnosis present

## 2021-02-23 DIAGNOSIS — O9089 Other complications of the puerperium, not elsewhere classified: Secondary | ICD-10-CM

## 2021-02-23 DIAGNOSIS — R0789 Other chest pain: Secondary | ICD-10-CM | POA: Diagnosis present

## 2021-02-23 DIAGNOSIS — O139 Gestational [pregnancy-induced] hypertension without significant proteinuria, unspecified trimester: Secondary | ICD-10-CM | POA: Diagnosis present

## 2021-02-23 DIAGNOSIS — R079 Chest pain, unspecified: Secondary | ICD-10-CM

## 2021-02-23 DIAGNOSIS — O1495 Unspecified pre-eclampsia, complicating the puerperium: Secondary | ICD-10-CM | POA: Diagnosis present

## 2021-02-23 LAB — CBC
HCT: 34.8 % — ABNORMAL LOW (ref 36.0–46.0)
Hemoglobin: 11.2 g/dL — ABNORMAL LOW (ref 12.0–15.0)
MCH: 27.9 pg (ref 26.0–34.0)
MCHC: 32.2 g/dL (ref 30.0–36.0)
MCV: 86.6 fL (ref 80.0–100.0)
Platelets: 283 10*3/uL (ref 150–400)
RBC: 4.02 MIL/uL (ref 3.87–5.11)
RDW: 13.6 % (ref 11.5–15.5)
WBC: 6.2 10*3/uL (ref 4.0–10.5)
nRBC: 0 % (ref 0.0–0.2)

## 2021-02-23 LAB — COMPREHENSIVE METABOLIC PANEL
ALT: 18 U/L (ref 0–44)
AST: 26 U/L (ref 15–41)
Albumin: 3 g/dL — ABNORMAL LOW (ref 3.5–5.0)
Alkaline Phosphatase: 154 U/L — ABNORMAL HIGH (ref 38–126)
Anion gap: 7 (ref 5–15)
BUN: 8 mg/dL (ref 6–20)
CO2: 23 mmol/L (ref 22–32)
Calcium: 8.4 mg/dL — ABNORMAL LOW (ref 8.9–10.3)
Chloride: 108 mmol/L (ref 98–111)
Creatinine, Ser: 0.68 mg/dL (ref 0.44–1.00)
GFR, Estimated: >60 mL/min (ref >60)
Glucose, Bld: 94 mg/dL (ref 70–99)
Potassium: 3.6 mmol/L (ref 3.5–5.1)
Sodium: 138 mmol/L (ref 135–145)
Total Bilirubin: 1 mg/dL (ref 0.3–1.2)
Total Protein: 6.1 g/dL — ABNORMAL LOW (ref 6.5–8.1)

## 2021-02-23 MED ORDER — LACTATED RINGERS IV SOLN
INTRAVENOUS | Status: DC
  Administered 2021-02-24: 75 mL/h via INTRAVENOUS

## 2021-02-23 MED ORDER — LABETALOL HCL 5 MG/ML IV SOLN: 40.0000 mg | INTRAVENOUS | Status: DC | PRN

## 2021-02-23 MED ORDER — HYDRALAZINE HCL 20 MG/ML IJ SOLN
5.0000 mg | INTRAMUSCULAR | Status: DC | PRN
  Administered 2021-02-23: 5 mg via INTRAVENOUS
  Filled 2021-02-23: qty 1

## 2021-02-23 MED ORDER — MAGNESIUM SULFATE BOLUS VIA INFUSION
4.0000 g | Freq: Once | INTRAVENOUS | Status: AC
  Administered 2021-02-23 (×2): 4 g via INTRAVENOUS
  Filled 2021-02-23: qty 1000

## 2021-02-23 MED ORDER — MAGNESIUM SULFATE 40 GM/1000ML IV SOLN
2.0000 g/h | INTRAVENOUS | Status: DC
  Administered 2021-02-23 – 2021-02-24 (×2): 2 g/h via INTRAVENOUS
  Filled 2021-02-23 (×2): qty 1000

## 2021-02-23 MED ORDER — HYDRALAZINE HCL 20 MG/ML IJ SOLN: 10.0000 mg | INTRAMUSCULAR | Status: DC | PRN

## 2021-02-23 MED ORDER — LABETALOL HCL 5 MG/ML IV SOLN: 20.0000 mg | INTRAVENOUS | Status: DC | PRN

## 2021-02-23 MED ORDER — LABETALOL HCL 5 MG/ML IV SOLN
20.0000 mg | INTRAVENOUS | Status: DC | PRN
Start: 2021-02-23 — End: 2021-02-26

## 2021-02-23 MED ORDER — HYDRALAZINE HCL 20 MG/ML IJ SOLN: 5.0000 mg | INTRAMUSCULAR | Status: DC | PRN

## 2021-02-23 MED ORDER — LABETALOL HCL 5 MG/ML IV SOLN
40.0000 mg | INTRAVENOUS | Status: DC | PRN
Start: 2021-02-23 — End: 2021-02-26

## 2021-02-23 NOTE — Plan of Care (Signed)
  Problem: Nutrition: Goal: Adequate nutrition will be maintained Outcome: Completed/Met   Problem: Coping: Goal: Level of anxiety will decrease Outcome: Completed/Met   

## 2021-02-23 NOTE — MAU Provider Note (Signed)
History     CSN: [id number]  Arrival date and time: 02/23/21 1604   Event Date/Time   First Provider Initiated Contact with Patient 02/23/21 1648      Chief Complaint  Patient presents with   Hypertension   Chest Pain   Leg Swelling   HPI Christine Ross is a 30 y.o. [id number] at 5 days PP from a vaginal delivery who presents with elevated blood pressures. She reports she noticed an increase in swelling today and felt tightness in her chest so she took her BP. She reports it was in the 140s/90s at home. She denies any HA, visual changes or epigastric pain. She denies any hx of HTN during the pregnancy except for a couple of elevated BPs on admission.  OB History     Gravida  4   Para  2   Term  2   Preterm  0   AB  2   Living  2      SAB  2   IAB  0   Ectopic  0   Multiple  0   Live Births  2           Past Medical History:  Diagnosis Date   Anxiety    Chlamydia contact, treated 02-2014   Chlamydial cervicitis 02/28/2014   Treated.    Depression    age 47yo, resolved   Eczema    GC (gonococcus) 02/28/2014   Sexual contact.  Negative culture, per patient.  Treated for contact.    Gonorrhea    Insomnia    Routine gynecological examination    Dr. Jean Rosenthal   Wears glasses     Past Surgical History:  Procedure Laterality Date   DILATION AND EVACUATION N/A 06/29/2014   Procedure: DILATATION AND EVACUATION;  Surgeon: Brock Bad, MD;  Location: [facility];  Service: Gynecology;  Laterality: N/A;   DILATION AND EVACUATION N/A 08/16/2014   Procedure: DILATATION AND EVACUATION;  Surgeon: Maxie Better, MD;  Location: [facility];  Service: Gynecology;  Laterality: N/A;   OPERATIVE ULTRASOUND N/A 08/16/2014   Procedure: OPERATIVE ULTRASOUND;  Surgeon: Maxie Better, MD;  Location: [facility];  Service: Gynecology;  Laterality: N/A;   WISDOM TOOTH EXTRACTION      Family History  Problem Relation Age of Onset   Diabetes Mother    Diabetes Maternal  Grandmother    Diabetes Paternal Grandmother     Social History   Tobacco Use   Smoking status: Former    Types: Cigars    Quit date: 02/24/2015    Years since quitting: 6.0   Smokeless tobacco: Never  Vaping Use   Vaping Use: Never used  Substance Use Topics   Alcohol use: Not Currently    Comment: social   Drug use: Not Currently    Frequency: 7.0 times per week    Types: Marijuana    Comment: last used yesterday    Allergies:  Allergies  Allergen Reactions   Nickel Rash    Medications Prior to Admission  Medication Sig Dispense Refill Last Dose   acetaminophen (TYLENOL) 325 MG tablet Take 2 tablets (650 mg total) by mouth every 4 (four) hours as needed (for pain scale < 4).      ibuprofen (ADVIL) 600 MG tablet Take 1 tablet (600 mg total) by mouth every 6 (six) hours. 30 tablet 0     Review of Systems  Constitutional: Negative.  Negative for fatigue and fever.  HENT: Negative.  Respiratory:  Positive for chest tightness. Negative for shortness of breath.   Cardiovascular: Negative.  Negative for chest pain.  Gastrointestinal: Negative.  Negative for abdominal pain, constipation, diarrhea, nausea and vomiting.  Genitourinary: Negative.  Negative for dysuria, vaginal bleeding and vaginal discharge.  Neurological: Negative.  Negative for dizziness and headaches.  Physical Exam   Blood pressure (!) 142/82, pulse 72, temperature 98.3 F (36.8 C), temperature source Oral, resp. rate 17, SpO2 98 %, currently breastfeeding.  Patient Vitals for the past 24 hrs:  BP Temp Temp src Pulse Resp SpO2  02/23/21 1815 (!) 156/84 -- -- (!) 58 -- 99 %  02/23/21 1808 (!) 155/90 -- -- (!) 58 -- --  02/23/21 1746 (!) 162/90 -- -- 60 -- 100 %  02/23/21 1733 (!) 153/88 -- -- (!) 58 -- --  02/23/21 1730 (!) 153/88 -- -- -- -- --  02/23/21 1715 (!) 160/86 -- -- 61 -- 100 %  02/23/21 1700 (!) 150/81 -- -- 62 -- 99 %  02/23/21 1645 (!) 142/82 -- -- 71 -- 98 %  02/23/21 1642 (!)  142/82 -- -- 72 -- --  02/23/21 1627 (!) 146/89 98.3 F (36.8 C) Oral 60 17 98 %     Physical Exam Vitals and nursing note reviewed.  Constitutional:      General: She is not in acute distress.    Appearance: She is well-developed.  HENT:     Head: Normocephalic.  Eyes:     Pupils: Pupils are equal, round, and reactive to light.  Cardiovascular:     Rate and Rhythm: Normal rate and regular rhythm.     Heart sounds: Normal heart sounds.  Pulmonary:     Effort: Pulmonary effort is normal. No respiratory distress.     Breath sounds: Normal breath sounds.  Abdominal:     General: Bowel sounds are normal. There is no distension.     Palpations: Abdomen is soft.     Tenderness: There is no abdominal tenderness.  Skin:    General: Skin is warm and dry.  Neurological:     Mental Status: She is alert and oriented to person, place, and time.  Psychiatric:        Mood and Affect: Mood normal.        Behavior: Behavior normal.        Thought Content: Thought content normal.        Judgment: Judgment normal.    MAU Course  Procedures Results for orders placed or performed during the hospital encounter of 02/23/21 (from the past 24 hour(s))  CBC     Status: Abnormal   Collection Time: 02/23/21  4:46 PM  Result Value Ref Range   WBC 6.2 4.0 - 10.5 K/uL   RBC 4.02 3.87 - 5.11 MIL/uL   Hemoglobin 11.2 (L) 12.0 - 15.0 g/dL   HCT 24.4 (L) 01.0 - 27.2 %   MCV 86.6 80.0 - 100.0 fL   MCH 27.9 26.0 - 34.0 pg   MCHC 32.2 30.0 - 36.0 g/dL   RDW 53.6 64.4 - 03.4 %   Platelets 283 150 - 400 K/uL   nRBC 0.0 0.0 - 0.2 %  Comprehensive metabolic panel     Status: Abnormal   Collection Time: 02/23/21  4:46 PM  Result Value Ref Range   Sodium 138 135 - 145 mmol/L   Potassium 3.6 3.5 - 5.1 mmol/L   Chloride 108 98 - 111 mmol/L   CO2 23 22 - 32  mmol/L   Glucose, Bld 94 70 - 99 mg/dL   BUN 8 6 - 20 mg/dL   Creatinine, Ser 9.62 0.44 - 1.00 mg/dL   Calcium 8.4 (L) 8.9 - 10.3 mg/dL   Total  Protein 6.1 (L) 6.5 - 8.1 g/dL   Albumin 3.0 (L) 3.5 - 5.0 g/dL   AST 26 15 - 41 U/L   ALT 18 0 - 44 U/L   Alkaline Phosphatase 154 (H) 38 - 126 U/L   Total Bilirubin 1.0 0.3 - 1.2 mg/dL   GFR, Estimated >95 >28 mL/min   Anion gap 7 5 - 15    DG CHEST PORT 1 VIEW  Result Date: 02/23/2021 CLINICAL DATA:  Chest pain since delivery earlier today. History of smoking. EXAM: PORTABLE CHEST 1 VIEW COMPARISON:  None. FINDINGS: Cardiac size accentuated by technique. There is minimal opacity at the RIGHT lung base, favoring atelectasis. Early infectious infiltrate could have a similar appearance. There is no pulmonary edema. IMPRESSION: Minimal RIGHT LOWER lobe atelectasis or early infiltrate. Electronically Signed   By: Norva Pavlov M.D.   On: 02/23/2021 17:36     MDM CBC, CMP ED EKG- normal sinus rhythm Chest Xray Preeclampsia protocol meds  Consulted with Dr. Alysia Penna given severe range BP's requiring antihypertensives- recommends admission to [facility]  Dr. Su Hilt notified of patient arrival and MD recommendations- will admit to [facility]  Assessment and Plan  Postpartum preeclampsia  -Admit to [facility] - Care turned over to MD  Rolm Bookbinder CNM 02/23/2021, 4:48 PM

## 2021-02-23 NOTE — H&P (Signed)
History and Physical      Chief Complaint  Patient presents with   Hypertension   Chest Pain   Leg Swelling    HPI Christine Ross is a 30 y.o. [id number] at 5 days PP from a vaginal delivery who presents with elevated blood pressures. P being readmit for GHTN with severe range BP, required x1 dose IV hydralazine in MAU, PCR was not repeated, CMP and cbc unremarkable. She reports she noticed an increase in swelling today and felt tightness in her chest so she took her BP. She reports it was in the 140s/90s at home. She denies any HA, visual changes or epigastric pain. She denies any hx of HTN during the pregnancy except for a couple of elevated BPs on admission.   OB History       Gravida  4   Para  2   Term  2   Preterm  0   AB  2   Living  2        SAB  2   IAB  0   Ectopic  0   Multiple  0   Live Births  2                   Past Medical History:  Diagnosis Date   Anxiety     Chlamydia contact, treated 02-2014   Chlamydial cervicitis 02/28/2014    Treated.    Depression      age 89yo, resolved   Eczema     GC (gonococcus) 02/28/2014    Sexual contact.  Negative culture, per patient.  Treated for contact.    Gonorrhea     Insomnia     Routine gynecological examination      Dr. Jean Rosenthal   Wears glasses             Past Surgical History:  Procedure Laterality Date   DILATION AND EVACUATION N/A 06/29/2014    Procedure: DILATATION AND EVACUATION;  Surgeon: Brock Bad, MD;  Location: [facility];  Service: Gynecology;  Laterality: N/A;   DILATION AND EVACUATION N/A 08/16/2014    Procedure: DILATATION AND EVACUATION;  Surgeon: Maxie Better, MD;  Location: [facility];  Service: Gynecology;  Laterality: N/A;   OPERATIVE ULTRASOUND N/A 08/16/2014    Procedure: OPERATIVE ULTRASOUND;  Surgeon: Maxie Better, MD;  Location: [facility];  Service: Gynecology;  Laterality: N/A;   WISDOM TOOTH EXTRACTION               Family History  Problem Relation Age of  Onset   Diabetes Mother     Diabetes Maternal Grandmother     Diabetes Paternal Grandmother        Social History         Tobacco Use   Smoking status: Former      Types: Cigars      Quit date: 02/24/2015      Years since quitting: 6.0   Smokeless tobacco: Never  Vaping Use   Vaping Use: Never used  Substance Use Topics   Alcohol use: Not Currently      Comment: social   Drug use: Not Currently      Frequency: 7.0 times per week      Types: Marijuana      Comment: last used yesterday      Allergies:      Allergies  Allergen Reactions   Nickel Rash             Medications  Prior to Admission  Medication Sig Dispense Refill Last Dose   acetaminophen (TYLENOL) 325 MG tablet Take 2 tablets (650 mg total) by mouth every 4 (four) hours as needed (for pain scale < 4).         ibuprofen (ADVIL) 600 MG tablet Take 1 tablet (600 mg total) by mouth every 6 (six) hours. 30 tablet 0        Review of Systems  Constitutional: Negative.  Negative for fatigue and fever.  HENT: Negative.    Respiratory:  Positive for chest tightness. Negative for shortness of breath.   Cardiovascular: Negative.  Negative for chest pain.  Gastrointestinal: Negative.  Negative for abdominal pain, constipation, diarrhea, nausea and vomiting.  Genitourinary: Negative.  Negative for dysuria, vaginal bleeding and vaginal discharge.  Neurological: Negative.  Negative for dizziness and headaches.  Physical Exam    Blood pressure (!) 142/82, pulse 72, temperature 98.3 F (36.8 C), temperature source Oral, resp. rate 17, SpO2 98 %, currently breastfeeding.   Patient Vitals for the past 24 hrs:   BP Temp Temp src Pulse Resp SpO2  02/23/21 1815 (!) 156/84 -- -- (!) 58 -- 99 %  02/23/21 1808 (!) 155/90 -- -- (!) 58 -- --  02/23/21 1746 (!) 162/90 -- -- 60 -- 100 %  02/23/21 1733 (!) 153/88 -- -- (!) 58 -- --  02/23/21 1730 (!) 153/88 -- -- -- -- --  02/23/21 1715 (!) 160/86 -- -- 61 -- 100 %  02/23/21  1700 (!) 150/81 -- -- 62 -- 99 %  02/23/21 1645 (!) 142/82 -- -- 71 -- 98 %  02/23/21 1642 (!) 142/82 -- -- 72 -- --  02/23/21 1627 (!) 146/89 98.3 F (36.8 C) Oral 60 17 98 %        Physical Exam Vitals and nursing note reviewed.  Constitutional:      General: She is not in acute distress.    Appearance: She is well-developed.  HENT:     Head: Normocephalic.  Eyes:     Pupils: Pupils are equal, round, and reactive to light.  Cardiovascular:     Rate and Rhythm: Normal rate and regular rhythm.     Heart sounds: Normal heart sounds.  Pulmonary:     Effort: Pulmonary effort is normal. No respiratory distress.     Breath sounds: Normal breath sounds.  Abdominal:     General: Bowel sounds are normal. There is no distension.     Palpations: Abdomen is soft.     Tenderness: There is no abdominal tenderness.  Skin:    General: Skin is warm and dry.  Neurological:     Mental Status: She is alert and oriented to person, place, and time.  Psychiatric:        Mood and Affect: Mood normal.        Behavior: Behavior normal.        Thought Content: Thought content normal.        Judgment: Judgment normal.      MAU Course  Procedures Lab Results Last 24 Hours       Results for orders placed or performed during the hospital encounter of 02/23/21 (from the past 24 hour(s))  CBC     Status: Abnormal    Collection Time: 02/23/21  4:46 PM  Result Value Ref Range    WBC 6.2 4.0 - 10.5 K/uL    RBC 4.02 3.87 - 5.11 MIL/uL    Hemoglobin 11.2 (L) 12.0 - 15.0  g/dL    HCT 29.5 (L) 18.8 - 46.0 %    MCV 86.6 80.0 - 100.0 fL    MCH 27.9 26.0 - 34.0 pg    MCHC 32.2 30.0 - 36.0 g/dL    RDW 41.6 60.6 - 30.1 %    Platelets 283 150 - 400 K/uL    nRBC 0.0 0.0 - 0.2 %  Comprehensive metabolic panel     Status: Abnormal    Collection Time: 02/23/21  4:46 PM  Result Value Ref Range    Sodium 138 135 - 145 mmol/L    Potassium 3.6 3.5 - 5.1 mmol/L    Chloride 108 98 - 111 mmol/L    CO2 23 22 -  32 mmol/L    Glucose, Bld 94 70 - 99 mg/dL    BUN 8 6 - 20 mg/dL    Creatinine, Ser 6.01 0.44 - 1.00 mg/dL    Calcium 8.4 (L) 8.9 - 10.3 mg/dL    Total Protein 6.1 (L) 6.5 - 8.1 g/dL    Albumin 3.0 (L) 3.5 - 5.0 g/dL    AST 26 15 - 41 U/L    ALT 18 0 - 44 U/L    Alkaline Phosphatase 154 (H) 38 - 126 U/L    Total Bilirubin 1.0 0.3 - 1.2 mg/dL    GFR, Estimated >09 >32 mL/min    Anion gap 7 5 - 15      DG CHEST PORT 1 VIEW   Result Date: 02/23/2021 CLINICAL DATA:  Chest pain since delivery earlier today. History of smoking. EXAM: PORTABLE CHEST 1 VIEW COMPARISON:  None. FINDINGS: Cardiac size accentuated by technique. There is minimal opacity at the RIGHT lung base, favoring atelectasis. Early infectious infiltrate could have a similar appearance. There is no pulmonary edema. IMPRESSION: Minimal RIGHT LOWER lobe atelectasis or early infiltrate. Electronically Signed   By: Norva Pavlov M.D.   On: 02/23/2021 17:36       MDM CBC, CMP unremarkable ED EKG- normal sinus rhythm Chest Xray IV hydralazine x1 dose    Assessment and Plan  Postpartum GHTN with severe range BP.   Christine Ross is a 30 y.o. [id number] at 5 days PP from a vaginal delivery who presents with elevated blood pressures and chest pain. P being readmit for GHTN with severe range BP, required x1 dose IV hydralazine in MAU, PCR was not repeated, CMP and cbc unremarkable. EKG unremarkable, chest xray no pulmonary embolism    -Admit to [facility] - SCD -Regular diet -Mag 4gm bolus with 2gm/hr to follow for 24 hours -fluid 75mg /hr -hydralazine protocol for severe range BP -repeat cmp, cbc in the morning  [facility], CNM  [facility] aware of POC and pt status.

## 2021-02-23 NOTE — MAU Note (Signed)
Pt reports to mau with c/o elevated bp of 149/96 today.  Pt reports chest pain that radiates to her back.  Pt denies SOB or visual changes.  Denies headache.   Reports increased swelling of both feet since Thursday.  BP 146/89 in triage.

## 2021-02-24 LAB — COMPREHENSIVE METABOLIC PANEL
ALT: 16 U/L (ref 0–44)
ALT: 17 U/L (ref 0–44)
AST: 26 U/L (ref 15–41)
AST: 22 U/L (ref 15–41)
Albumin: 2.9 g/dL — ABNORMAL LOW (ref 3.5–5.0)
Albumin: 3 g/dL — ABNORMAL LOW (ref 3.5–5.0)
Alkaline Phosphatase: 131 U/L — ABNORMAL HIGH (ref 38–126)
Alkaline Phosphatase: 139 U/L — ABNORMAL HIGH (ref 38–126)
Anion gap: 9 (ref 5–15)
Anion gap: 8 (ref 5–15)
BUN: 5 mg/dL — ABNORMAL LOW (ref 6–20)
BUN: <5 mg/dL — ABNORMAL LOW (ref 6–20)
CO2: 23 mmol/L (ref 22–32)
CO2: 23 mmol/L (ref 22–32)
Calcium: 7.3 mg/dL — ABNORMAL LOW (ref 8.9–10.3)
Calcium: 6.9 mg/dL — ABNORMAL LOW (ref 8.9–10.3)
Chloride: 106 mmol/L (ref 98–111)
Chloride: 108 mmol/L (ref 98–111)
Creatinine, Ser: 0.68 mg/dL (ref 0.44–1.00)
Creatinine, Ser: 0.64 mg/dL (ref 0.44–1.00)
GFR, Estimated: >60 mL/min (ref >60)
GFR, Estimated: >60 mL/min (ref >60)
Glucose, Bld: 87 mg/dL (ref 70–99)
Glucose, Bld: 98 mg/dL (ref 70–99)
Potassium: 3.7 mmol/L (ref 3.5–5.1)
Potassium: 3.8 mmol/L (ref 3.5–5.1)
Sodium: 138 mmol/L (ref 135–145)
Sodium: 139 mmol/L (ref 135–145)
Total Bilirubin: 1 mg/dL (ref 0.3–1.2)
Total Bilirubin: 1 mg/dL (ref 0.3–1.2)
Total Protein: 6 g/dL — ABNORMAL LOW (ref 6.5–8.1)
Total Protein: 6.3 g/dL — ABNORMAL LOW (ref 6.5–8.1)

## 2021-02-24 LAB — CBC WITH DIFFERENTIAL/PLATELET
Abs Immature Granulocytes: 0.03 10*3/uL (ref 0.00–0.07)
Basophils Absolute: 0 10*3/uL (ref 0.0–0.1)
Basophils Relative: 0 %
Eosinophils Absolute: 0.1 10*3/uL (ref 0.0–0.5)
Eosinophils Relative: 2 %
HCT: 36.3 % (ref 36.0–46.0)
Hemoglobin: 12.1 g/dL (ref 12.0–15.0)
Immature Granulocytes: 0 %
Lymphocytes Relative: 38 %
Lymphs Abs: 2.6 10*3/uL (ref 0.7–4.0)
MCH: 28.4 pg (ref 26.0–34.0)
MCHC: 33.3 g/dL (ref 30.0–36.0)
MCV: 85.2 fL (ref 80.0–100.0)
Monocytes Absolute: 0.6 10*3/uL (ref 0.1–1.0)
Monocytes Relative: 8 %
Neutro Abs: 3.6 10*3/uL (ref 1.7–7.7)
Neutrophils Relative %: 52 %
Platelets: 301 10*3/uL (ref 150–400)
RBC: 4.26 MIL/uL (ref 3.87–5.11)
RDW: 13.7 % (ref 11.5–15.5)
WBC: 6.9 10*3/uL (ref 4.0–10.5)
nRBC: 0 % (ref 0.0–0.2)

## 2021-02-24 LAB — CBC
HCT: 38.8 % (ref 36.0–46.0)
Hemoglobin: 12.9 g/dL (ref 12.0–15.0)
MCH: 28.4 pg (ref 26.0–34.0)
MCHC: 33.2 g/dL (ref 30.0–36.0)
MCV: 85.5 fL (ref 80.0–100.0)
Platelets: 314 10*3/uL (ref 150–400)
RBC: 4.54 MIL/uL (ref 3.87–5.11)
RDW: 13.5 % (ref 11.5–15.5)
WBC: 8.4 10*3/uL (ref 4.0–10.5)
nRBC: 0 % (ref 0.0–0.2)

## 2021-02-24 LAB — MAGNESIUM: Magnesium: 5.8 mg/dL — ABNORMAL HIGH (ref 1.7–2.4)

## 2021-02-24 MED ORDER — BUTALBITAL-APAP-CAFFEINE 50-325-40 MG PO TABS
1.0000 | ORAL_TABLET | Freq: Three times a day (TID) | ORAL | Status: DC | PRN
  Administered 2021-02-24 – 2021-02-26 (×6): 1 via ORAL
  Filled 2021-02-24 (×5): qty 1
  Filled 2021-02-24: qty 2

## 2021-02-24 MED ORDER — CALCIUM CARBONATE ANTACID 500 MG PO CHEW
2.0000 | CHEWABLE_TABLET | Freq: Three times a day (TID) | ORAL | Status: DC | PRN
  Administered 2021-02-24: 400 mg via ORAL
  Filled 2021-02-24: qty 2

## 2021-02-24 NOTE — Progress Notes (Addendum)
Chief Complaint:  Postpartum GHTN with severe range BP   HPI:   Christine Ross is a 30 y.o. [id number] admitted 5 days PP from a vaginal delivery who presents with elevated blood pressures. Pt was readmitted for [facility] with severe range BP, required x1 dose IV hydralazine in MAU, PCR was not repeated, CMP and cbc unremarkable. She reports she noticed an increase in swelling today and felt tightness in her chest so she took her BP. She reports it was in the 140s/90s at home. She denies any HA, visual changes or epigastric pain. She denies any hx of HTN during the pregnancy except for a couple of elevated BPs on admission.      Past Medical History:     Past Medical History:  Diagnosis Date   Abnormal Pap smear 08/20/2011    ASCUS    Anemia 2008   GBS carrier     H/O candidiasis     H/O pre-eclampsia in prior pregnancy, currently pregnant     H/O varicella     Hx: UTI (urinary tract infection) 11/13/2008   Hypertension      During pregnancy.    Postpartum hypertension     Preterm labor     Smoker        Past obstetric history:                 OB History  Gravida Para Term Preterm AB Living  5 3 2 1 1 3   SAB TAB Ectopic Multiple Live Births             3        # Outcome Date GA Lbr Len/2nd Weight Sex Delivery Anes PTL Lv  5 Current                    4 Term 01/18/12 [redacted]w[redacted]d   2200 g M CS-LTranv EPI   LIV     Birth Comments: No problems at birth  3 Term 07/01/08 [redacted]w[redacted]d 10:00 2722 g M Vag-Spont EPI   LIV  2 Preterm 11/2006 [redacted]w[redacted]d   822 g F CS-LTranv None   LIV  1 AB 10/2004 [redacted]w[redacted]d     U       DEC      Past Surgical History:      Past Surgical History:  Procedure Laterality Date   CESAREAN SECTION   2008   CESAREAN SECTION   01/18/2012    Procedure: CESAREAN SECTION;  Surgeon: 01/20/2012, MD;  Location: [facility];  Service: Gynecology;  Laterality: N/A;  Repeat cesarean section of baby boy at 2115 APGAR 3/7   DILATION AND CURETTAGE OF UTERUS       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       WISDOM TOOTH EXTRACTION   2011      Family History:      Family History  Problem Relation Age of Onset   Hypertension Maternal Grandmother     Diabetes Maternal Grandmother     Lupus Maternal Grandmother     Cancer Paternal Grandmother          Cervical   ADD / ADHD Maternal Grandfather     Asthma Daughter     Learning disabilities Cousin          autism x2   Vision loss Maternal Aunt        Social History: Social History         Tobacco Use   Smoking  status: Current Every Day Smoker      Packs/day: 0.50      Years: 9.00      Pack years: 4.50      Types: Cigarettes   Smokeless tobacco: Never Used  Vaping Use   Vaping Use: Never used  Substance Use Topics   Alcohol use: Not Currently      Comment: occ   Drug use: Yes      Types: Marijuana      Comment: 02/08/20      Allergies: No Known Allergies   Meds:         Medications Prior to Admission  Medication Sig Dispense Refill Last Dose   acetaminophen (TYLENOL) 500 MG tablet Take 500 mg by mouth every 6 (six) hours as needed for headache.         cyclobenzaprine (FLEXERIL) 10 MG tablet Take 1 tablet (10 mg total) by mouth 3 (three) times daily. (Patient not taking: Reported on 03/23/2020) 30 tablet 0     diphenhydramine-acetaminophen (TYLENOL PM) 25-500 MG TABS tablet Take 1 tablet by mouth at bedtime as needed. (Patient not taking: Reported on 04/06/2020)         Doxylamine-Pyridoxine 10-10 MG TBEC Take two tablets at bedtime on day 1 and 2; if symptoms persist, take 1 tablet in morning and 2 tablets at bedtime on day 3; if symptoms persist, may increase to 1 tablet in morning, 1 tablet mid-afternoon, and 2 tablets at bedtime on day 4 (maximum: doxylamine 40 mg/pyridoxine 40 mg (4 tablets) per day). (Patient not taking: Reported on 04/06/2020) 60 tablet 0     ondansetron (ZOFRAN ODT) 4 MG disintegrating tablet Take 1 tablet (4 mg total) by mouth every 8 (eight) hours as needed for nausea or vomiting. 20  tablet 1     pantoprazole (PROTONIX) 20 MG tablet Take 1 tablet (20 mg total) by mouth daily. (Patient not taking: Reported on 03/23/2020) 30 tablet 1     polyethylene glycol powder (MIRALAX) 17 GM/SCOOP powder Take 1 scoop by mouth mixed with water, juice, and/ or milk daily (Patient taking differently: Take 17 g by mouth daily as needed for mild constipation. Take 1 scoop by mouth mixed with water, juice, and/ or milk daily) 255 g 0     Prenat w/o A Vit-FeFum-FePo-FA (CONCEPT OB) 130-92.4-1 MG CAPS Take 1 tablet by mouth daily. 30 capsule 12     promethazine (PHENERGAN) 25 MG suppository Place 1 suppository (25 mg total) rectally every 6 (six) hours as needed for nausea. (Patient not taking: Reported on 03/23/2020) 12 suppository 1     sertraline (ZOLOFT) 50 MG tablet Take 1 tablet (50 mg total) by mouth daily. 30 tablet 0     sucralfate (CARAFATE) 1 g tablet Take 1 tablet (1 g total) by mouth 4 (four) times daily -  with meals and at bedtime. 30 tablet 2     sucralfate (CARAFATE) 1 GM/10ML suspension Take 10 mLs (1 g total) by mouth 4 (four) times daily -  with meals and at bedtime. (Patient not taking: Reported on 03/23/2020) 420 mL 0        I have reviewed patient's Past Medical Hx, Surgical Hx, Family Hx, Social Hx, medications and allergies.    ROS:  Review of Systems  Constitutional: Negative for fever.  HENT: Negative for sore throat.   Eyes: Negative for blurred vision.  Gastrointestinal: Negative for abdominal pain and nausea.  Musculoskeletal: Negative for back pain.  Neurological: Positive for headaches.  Negative for dizziness.    Other systems negative   Physical Exam    Patient Vitals for the past 24 hrs:   BP Temp Pulse Resp Height Weight  04/11/20 0642 126/84 98.6 F (37 C) 92 18 5\' 1"  (1.549 m) 56.2 kg        Vitals:    04/11/20 0642 04/11/20 0715  BP: 126/84 120/72  Pulse: 92 82  Resp: 18 16  Temp: 98.6 F (37 C) 98.4 F (36.9 C)  TempSrc:   Oral  SpO2:    99%  Weight: 56.2 kg    Height: 5\' 1"  (1.549 m)        Constitutional: Well-developed, well-nourished female in no acute distress.  Cardiovascular: normal rate and rhythm Respiratory: normal effort, clear to auscultation bilaterally GI: Abd soft, non-tender, gravid appropriate for gestational age.   No rebound or guarding. MS: Extremities nontender, no edema Neurologic: Alert and oriented x 4.  DTRs 2+, no clonus GU: Neg CVAT.       Labs:  Results for CHARLIENE, INOUE (MRN ) as of 02/24/2021 13:04  Ref. Range 02/24/2021 03:43  Sodium Latest Ref Range: 135 - 145 mmol/L 138  Potassium Latest Ref Range: 3.5 - 5.1 mmol/L 3.7  Chloride Latest Ref Range: 98 - 111 mmol/L 106  CO2 Latest Ref Range: 22 - 32 mmol/L 23  Glucose Latest Ref Range: 70 - 99 mg/dL 87  BUN Latest Ref Range: 6 - 20 mg/dL 5 (L)  Creatinine Latest Ref Range: 0.44 - 1.00 mg/dL 04/26/2021  Calcium Latest Ref Range: 8.9 - 10.3 mg/dL 7.3 (L)  Anion gap Latest Ref Range: 5 - 15  9  Alkaline Phosphatase Latest Ref Range: 38 - 126 U/L 131 (H)  Albumin Latest Ref Range: 3.5 - 5.0 g/dL 2.9 (L)  AST Latest Ref Range: 15 - 41 U/L 26  ALT Latest Ref Range: 0 - 44 U/L 16  Total Protein Latest Ref Range: 6.5 - 8.1 g/dL 6.0 (L)  Total Bilirubin Latest Ref Range: 0.3 - 1.2 mg/dL 1.0  GFR, Estimated Latest Ref Range: >60 mL/min >60    Ref. Range 02/24/2021 03:43  WBC Latest Ref Range: 4.0 - 10.5 K/uL 6.9  RBC Latest Ref Range: 3.87 - 5.11 MIL/uL 4.26  Hemoglobin Latest Ref Range: 12.0 - 15.0 g/dL 2.58  HCT Latest Ref Range: 36.0 - 46.0 % 36.3  MCV Latest Ref Range: 80.0 - 100.0 fL 85.2  MCH Latest Ref Range: 26.0 - 34.0 pg 28.4  MCHC Latest Ref Range: 30.0 - 36.0 g/dL 04/26/2021  RDW Latest Ref Range: 11.5 - 15.5 % 13.7  Platelets Latest Ref Range: 150 - 400 K/uL 301  nRBC Latest Ref Range: 0.0 - 0.2 % 0.0     --/--/O POS (10/28 1630)   Imaging:  Result Date: 02/23/2021 CLINICAL DATA:  Chest pain since delivery earlier  today. History of smoking. EXAM: PORTABLE CHEST 1 VIEW COMPARISON:  None. FINDINGS: Cardiac size accentuated by technique. There is minimal opacity at the RIGHT lung base, favoring atelectasis. Early infectious infiltrate could have a similar appearance. There is no pulmonary edema. IMPRESSION: Minimal RIGHT LOWER lobe atelectasis or early infiltrate. Electronically Signed   By: 11-06-1992 M.D.   On: 02/23/2021 17:36          Assessment: Postpartum GHTN with severe range BP.    Christine Ross is a 30 y.o. Christine Ross at 6 days PP from a vaginal delivery who presents with elevated blood pressures and chest pain. Mag  4gm bolus with 2gm/hr began at 1900 on 02/23/21. Patient called with headache overnight. Some relief with Fioricet. Morning labs were unremarkable. Good output.       Plan: - SCD -Regular diet -Mag 2gm/hr until 1900. -fluid 75mg /hr -hydralazine protocol for severe range BP -Monitor BPs overnight.   Plan reviewed with Dr. .   Su Hilt [place], CNM  Pt given tums this afternoon for pain in chest radiating to back with relief and s/p w/u yesterday in MAU with no SOB or difficulty breathing.  HA relived with fioricet but returns.  Called by RN at about 1645 reporting only 100cc since 10am.  Labs ordered and pt asked to void.  She voided an additional 350cc so total of 450cc over 7hrs.  Pt seen by CNM when she had complaints and overall doing well and resting comfortably.

## 2021-02-24 NOTE — Plan of Care (Signed)
  Problem: Education: Goal: Knowledge of General Education information will improve Description: Including pain rating scale, medication(s)/side effects and non-pharmacologic comfort measures Outcome: Completed/Met   Problem: Activity: Goal: Risk for activity intolerance will decrease Outcome: Completed/Met   Problem: Elimination: Goal: Will not experience complications related to urinary retention Outcome: Completed/Met   Problem: Education: Goal: Knowledge of disease or condition will improve Outcome: Completed/Met Goal: Knowledge of the prescribed therapeutic regimen will improve Outcome: Completed/Met

## 2021-02-25 MED ORDER — OXYCODONE HCL 5 MG PO TABS
10.0000 mg | ORAL_TABLET | Freq: Once | ORAL | Status: AC
  Administered 2021-02-25: 10 mg via ORAL
  Filled 2021-02-25: qty 2

## 2021-02-25 MED ORDER — NIFEDIPINE ER OSMOTIC RELEASE 30 MG PO TB24
30.0000 mg | ORAL_TABLET | Freq: Once | ORAL | Status: AC
  Administered 2021-02-25: 30 mg via ORAL
  Filled 2021-02-25: qty 1

## 2021-02-25 MED ORDER — NIFEDIPINE ER OSMOTIC RELEASE 30 MG PO TB24
30.0000 mg | ORAL_TABLET | Freq: Every day | ORAL | Status: DC
  Administered 2021-02-25: 30 mg via ORAL
  Filled 2021-02-25: qty 1

## 2021-02-25 MED ORDER — NIFEDIPINE ER OSMOTIC RELEASE 60 MG PO TB24
60.0000 mg | ORAL_TABLET | Freq: Every day | ORAL | Status: DC
  Administered 2021-02-26: 60 mg via ORAL
  Filled 2021-02-25: qty 1

## 2021-02-25 NOTE — Lactation Note (Signed)
Lactation Consultation Note  Patient Name: Christine Ross MOLMB'E Date: 02/25/2021 Reason for consult: Follow-up assessment Age:30 y.o.  PP Readmit for elevated BP. Mother is breastfeeding and formula feeding. Her R breast is full and leaking. Suggest latching baby to empty.  Baby latched for approx 10 min and mother had relief from fullness. Encouraged breastfeeding on demand at least 8 -12 times per day. Suggest breastfeeding before offering formula. Provided mother with hand pump to relieve discomfort as needed for 5 min.  24 mm flange seems appropriate but provided mother with 27 mm also.  Reviewed milk storage guidelines. Mother has DEBP at home.     Feeding Mother's Current Feeding Choice: Breast Milk and Formula  LATCH Score Latch: Grasps breast easily, tongue down, lips flanged, rhythmical sucking.  Audible Swallowing: A few with stimulation  Type of Nipple: Everted at rest and after stimulation  Comfort (Breast/Nipple): Soft / non-tender  Hold (Positioning): Assistance needed to correctly position infant at breast and maintain latch.  LATCH Score: 8   Lactation Tools Discussed/Used Tools: Pump Breast pump type: Manual Pump Education: Setup, frequency, and cleaning;Milk Storage Reason for Pumping: empty breasts PRN Pumping frequency:  (PRN)  Interventions Interventions: Assisted with latch;Hand pump;Education  Consult Status Consult Status: Follow-up Date: 02/26/21 Follow-up type: In-patient    [name] [facility] 02/25/2021, 9:28 AM

## 2021-02-25 NOTE — Progress Notes (Signed)
Post Partum Day 7 HD #3 s/p readmission for post partum preeclampsia  Subjective: Patient notes that her headache has resolved. She also notes that her chest pain has resolved.  She feels much better.   Objective: Blood pressure (!) 153/82, pulse (!) 52, temperature 98.2 F (36.8 C), temperature source Oral, resp. rate 18, height 5' (1.524 m), weight 69 kg, SpO2 100 %, currently breastfeeding.  Physical Exam:  General: alert, cooperative, and no distress Lochia: appropriate Uterine Fundus: firm DVT Evaluation: No evidence of DVT seen on physical exam. Negative Homan's sign. No cords or calf tenderness.  Recent Labs    02/24/21 0343 02/24/21 1702  HGB 12.1 12.9  HCT 36.3 38.8    Assessment/Plan: 30 year old with post partum preeclampsia Patient still with elevated blood pressures. Will increase Procardia from 30 mg to 60 mg XL daily Pre E blood work normal.  Will continue to observe patient overnight  Possible discharge tomorrow.    LOS: 2 days   [facility] 02/25/2021, 3:03 PM

## 2021-02-26 DIAGNOSIS — O1495 Unspecified pre-eclampsia, complicating the puerperium: Secondary | ICD-10-CM

## 2021-02-26 MED ORDER — NIFEDIPINE ER 60 MG PO TB24: 60.0000 mg | ORAL_TABLET | Freq: Every day | ORAL | 3 refills | Status: AC

## 2021-02-26 MED ORDER — PANTOPRAZOLE SODIUM 40 MG PO TBEC: 40.0000 mg | DELAYED_RELEASE_TABLET | Freq: Every day | ORAL | 3 refills | Status: AC

## 2021-02-26 NOTE — Discharge Summary (Addendum)
Physician Discharge Summary  Patient ID: Christine Ross MRN: [id number] DOB/AGE: 1990/07/25 30 y.o.  Admit date: 02/23/2021 Discharge date: 02/26/2021  Admission Diagnoses:  Discharge Diagnoses:  Principal Problem:   Preeclampsia in postpartum period Active Problems:   Gestational hypertension   Discharged Condition: good  Hospital Course: Patient re-admitted 7 days post partum for headache, severe range blood pressures and chest pain.  Cardiac work up performed and was negative for MI or cardiomyopathy. Patient placed on 24 hours of magnesium sulfate IV for seizure prophylaxis and IV anti-hypertensive's given.  Headache resolved with fioricet and one dose of oxycodone.  Patient on day 4 was doing well, blood pressures normal to mild range. Procardia was added to her regimen. Patient was discharged home.   Consults: None  Significant Diagnostic Studies: EKG neg  Treatments: IV hydration, analgesia: acetaminophen, and oxycodone, magnesium sulfate  Discharge Exam: Blood pressure 133/77, pulse 75, temperature 98.3 F (36.8 C), temperature source Oral, resp. rate 17, height 5' (1.524 m), weight 69 kg, SpO2 100 %, currently breastfeeding. General appearance: alert, cooperative, and no distress   Disposition: Discharge disposition: 01-Home or Self Care      Discharge Instructions     Call MD for:   Complete by: As directed    Signs of preeclampsia including severe headache, blurry vision, spots in your vision   Call MD for:  difficulty breathing, headache or visual disturbances   Complete by: As directed    Call MD for:  hives   Complete by: As directed    Call MD for:  persistant dizziness or light-headedness   Complete by: As directed    Call MD for:  persistant nausea and vomiting   Complete by: As directed    Call MD for:  redness, tenderness, or signs of infection (pain, swelling, redness, odor or green/yellow discharge around incision site)   Complete by: As  directed    Call MD for:  severe uncontrolled pain   Complete by: As directed    Call MD for:  temperature >100.4   Complete by: As directed    Diet - low sodium heart healthy   Complete by: As directed    Discharge instructions   Complete by: As directed    If you have not made a follow up visit please call [place] office at [phone] or if you have any questions or concerns.   Increase activity slowly   Complete by: As directed       Allergies as of 02/26/2021       Reactions   Nickel Rash        Medication List     TAKE these medications    acetaminophen 325 MG tablet Commonly known as: Tylenol Take 2 tablets (650 mg total) by mouth every 4 (four) hours as needed (for pain scale < 4).   ibuprofen 600 MG tablet Commonly known as: ADVIL Take 1 tablet (600 mg total) by mouth every 6 (six) hours.   NIFEdipine 60 MG 24 hr tablet Commonly known as: ADALAT CC Take 1 tablet (60 mg total) by mouth daily. Start taking on: February 27, 2021   pantoprazole 40 MG tablet Commonly known as: PROTONIX Take 1 tablet (40 mg total) by mouth daily.         SignedEssie Hart 02/26/2021, 11:59 AM

## 2021-02-26 NOTE — Plan of Care (Signed)
  Problem: Health Behavior/Discharge Planning: Goal: Ability to manage health-related needs will improve Outcome: Adequate for Discharge   

## 2021-02-28 ENCOUNTER — Telehealth (HOSPITAL_COMMUNITY): Payer: Self-pay | Admitting: *Deleted

## 2021-02-28 NOTE — Telephone Encounter (Signed)
Voice mailbox full, no message left.  Duffy Rhody, RN 02-28-2021 at 1:02pm

## 2022-01-29 ENCOUNTER — Encounter: Payer: Self-pay | Admitting: Internal Medicine

## 2022-03-04 ENCOUNTER — Encounter: Payer: Self-pay | Admitting: Internal Medicine

## 2022-10-13 ENCOUNTER — Other Ambulatory Visit: Payer: Self-pay

## 2022-10-13 ENCOUNTER — Encounter (HOSPITAL_BASED_OUTPATIENT_CLINIC_OR_DEPARTMENT_OTHER): Payer: Self-pay

## 2022-10-13 DIAGNOSIS — R5383 Other fatigue: Secondary | ICD-10-CM | POA: Insufficient documentation

## 2022-10-13 DIAGNOSIS — Z5321 Procedure and treatment not carried out due to patient leaving prior to being seen by health care provider: Secondary | ICD-10-CM | POA: Insufficient documentation

## 2022-10-13 DIAGNOSIS — R112 Nausea with vomiting, unspecified: Secondary | ICD-10-CM | POA: Insufficient documentation

## 2022-10-13 DIAGNOSIS — R197 Diarrhea, unspecified: Secondary | ICD-10-CM | POA: Diagnosis not present

## 2022-10-13 DIAGNOSIS — R6883 Chills (without fever): Secondary | ICD-10-CM | POA: Diagnosis not present

## 2022-10-13 LAB — COMPREHENSIVE METABOLIC PANEL
ALT: 9 U/L (ref 0–44)
AST: 16 U/L (ref 15–41)
Albumin: 4.8 g/dL (ref 3.5–5.0)
Alkaline Phosphatase: 42 U/L (ref 38–126)
Anion gap: 12 (ref 5–15)
BUN: 11 mg/dL (ref 6–20)
CO2: 21 mmol/L — ABNORMAL LOW (ref 22–32)
Calcium: 9.1 mg/dL (ref 8.9–10.3)
Chloride: 103 mmol/L (ref 98–111)
Creatinine, Ser: 0.68 mg/dL (ref 0.44–1.00)
GFR, Estimated: >60 mL/min (ref >60)
Glucose, Bld: 97 mg/dL (ref 70–99)
Potassium: 3.5 mmol/L (ref 3.5–5.1)
Sodium: 136 mmol/L (ref 135–145)
Total Bilirubin: 2.3 mg/dL — ABNORMAL HIGH (ref 0.3–1.2)
Total Protein: 7.4 g/dL (ref 6.5–8.1)

## 2022-10-13 LAB — PREGNANCY, URINE: Preg Test, Ur: NEGATIVE

## 2022-10-13 LAB — LIPASE, BLOOD: Lipase: <10 U/L — ABNORMAL LOW (ref 11–51)

## 2022-10-13 LAB — URINALYSIS, ROUTINE W REFLEX MICROSCOPIC
Bilirubin Urine: NEGATIVE
Glucose, UA: NEGATIVE mg/dL
Hgb urine dipstick: NEGATIVE
Ketones, ur: 40 mg/dL — AB
Leukocytes,Ua: NEGATIVE
Nitrite: NEGATIVE
Specific Gravity, Urine: 1.033 — ABNORMAL HIGH (ref 1.005–1.030)
pH: 6 (ref 5.0–8.0)

## 2022-10-13 LAB — CBC
HCT: 41.2 % (ref 36.0–46.0)
Hemoglobin: 14.5 g/dL (ref 12.0–15.0)
MCH: 31.3 pg (ref 26.0–34.0)
MCHC: 35.2 g/dL (ref 30.0–36.0)
MCV: 89 fL (ref 80.0–100.0)
Platelets: 183 10*3/uL (ref 150–400)
RBC: 4.63 MIL/uL (ref 3.87–5.11)
RDW: 12.1 % (ref 11.5–15.5)
WBC: 6.3 10*3/uL (ref 4.0–10.5)
nRBC: 0 % (ref 0.0–0.2)

## 2022-10-13 NOTE — ED Triage Notes (Signed)
Patient here POV from Home.  Endorses N/V/D, Chills, Fatigue that began Friday. Intermittent but more constant today.   No Known Fevers. No pain. No Dysuria.   NAD Noted during Triage. A&Ox4. GCS 15. Ambulatory.

## 2022-10-14 ENCOUNTER — Emergency Department (HOSPITAL_BASED_OUTPATIENT_CLINIC_OR_DEPARTMENT_OTHER)
Admission: EM | Admit: 2022-10-14 | Discharge: 2022-10-14 | Discharge status: Against Medical Advice | Payer: 59 | Attending: Emergency Medicine | Admitting: Emergency Medicine

## 2022-10-14 ENCOUNTER — Ambulatory Visit
Admission: EM | Admit: 2022-10-14 | Discharge: 2022-10-14 | Disposition: A | Discharge status: Home / Self Care | Payer: 59 | Attending: Family Medicine | Admitting: Family Medicine

## 2022-10-14 NOTE — ED Notes (Signed)
Called pt in lobby 2 x no response.

## 2022-10-14 NOTE — ED Notes (Signed)
Called pt again no response

## 2023-11-02 ENCOUNTER — Ambulatory Visit: Payer: Self-pay | Admitting: *Deleted

## 2023-11-02 ENCOUNTER — Ambulatory Visit: Payer: Self-pay

## 2023-11-02 NOTE — Telephone Encounter (Signed)
 FYI Only or Action Required?: FYI only for provider  Patient was last seen in primary care on no longer established. Called Nurse Triage reporting Dizziness and Shaking. Symptoms began several weeks ago. Interventions attempted: Nothing. Symptoms are: unchanged.  Triage Disposition: See Physician Within 24 Hours  Patient/caregiver understands and will follow disposition?: Yes                                    Copied from CRM [id number]. Topic: Clinical - Red Word Triage >> Nov 02, 2023  2:17 PM Fonda T wrote: [facility] that prompted transfer to Nurse Triage: Dizziness, light headed, both hands "shaky"  Reason for Disposition  [1] MODERATE dizziness (e.g., interferes with normal activities) AND [2] has NOT been evaluated by doctor (or NP/PA) for this  (Exception: Dizziness caused by heat exposure, sudden standing, or poor fluid intake.)  Answer Assessment - Initial Assessment Questions 1. DESCRIPTION: "Describe your dizziness."     Experiences periods where she feels "faint" and periods where she feels like the "room is spinning" 2. LIGHTHEADED: "Do you feel lightheaded?" (e.g., somewhat faint, woozy, weak upon standing)     Yes, at times 3. VERTIGO: "Do you feel like either you or the room is spinning or tilting?" (i.e. vertigo)     Yes, at times 4. SEVERITY: "How bad is it?"  "Do you feel like you are going to faint?" "Can you stand and walk?"   - MILD: Feels slightly dizzy, but walking normally.   - MODERATE: Feels unsteady when walking, but not falling; interferes with normal activities (e.g., school, work).   - SEVERE: Unable to walk without falling, or requires assistance to walk without falling; feels like passing out now.      States this does not affect ability walk 5. ONSET:  "When did the dizziness begin?"     2 weeks 6. AGGRAVATING FACTORS: "Does anything make it worse?" (e.g., standing, change in head position)     Unknown 8. CAUSE: "What do  you think is causing the dizziness?"     Unsure 9. RECURRENT SYMPTOM: "Have you had dizziness before?" If Yes, ask: "When was the last time?" "What happened that time?"     Denies  10. OTHER SYMPTOMS: "Do you have any other symptoms?" (e.g., fever, chest pain, vomiting, diarrhea, bleeding)       Headaches, both hands are jittery  Denies chest pain, denies difficulty breathing, denies feeling off balance, denies fever BP 144/90  Protocols used: Dizziness - Lightheadedness-A-AH

## 2023-11-02 NOTE — Telephone Encounter (Signed)
 Copied from CRM [id number]. Topic: Clinical - Red Word Triage >> Nov 02, 2023  2:17 PM Fonda T wrote: [facility] that prompted transfer to Nurse Triage: Dizziness, light headed, both hands "shaky"

## 2023-11-03 ENCOUNTER — Ambulatory Visit: Payer: Self-pay
